# Patient Record
Sex: Female | Born: 1955 | Race: White | Hispanic: No | Marital: Married | Smoking: Former smoker
Health system: Southern US, Community
[De-identification: ages and names within clinical notes are randomized; demographics above are authoritative.]

## PROBLEM LIST (undated history)

## (undated) DIAGNOSIS — M1990 Unspecified osteoarthritis, unspecified site: Secondary | ICD-10-CM

## (undated) DIAGNOSIS — E213 Hyperparathyroidism, unspecified: Secondary | ICD-10-CM

## (undated) DIAGNOSIS — F419 Anxiety disorder, unspecified: Secondary | ICD-10-CM

## (undated) DIAGNOSIS — E7800 Pure hypercholesterolemia, unspecified: Secondary | ICD-10-CM

## (undated) DIAGNOSIS — T4145XA Adverse effect of unspecified anesthetic, initial encounter: Secondary | ICD-10-CM

## (undated) DIAGNOSIS — R0602 Shortness of breath: Secondary | ICD-10-CM

## (undated) DIAGNOSIS — T8859XA Other complications of anesthesia, initial encounter: Secondary | ICD-10-CM

## (undated) DIAGNOSIS — Z87442 Personal history of urinary calculi: Secondary | ICD-10-CM

## (undated) DIAGNOSIS — K8020 Calculus of gallbladder without cholecystitis without obstruction: Secondary | ICD-10-CM

## (undated) DIAGNOSIS — E0590 Thyrotoxicosis, unspecified without thyrotoxic crisis or storm: Secondary | ICD-10-CM

## (undated) DIAGNOSIS — D351 Benign neoplasm of parathyroid gland: Secondary | ICD-10-CM

## (undated) DIAGNOSIS — I2699 Other pulmonary embolism without acute cor pulmonale: Secondary | ICD-10-CM

## (undated) DIAGNOSIS — Z9889 Other specified postprocedural states: Secondary | ICD-10-CM

## (undated) DIAGNOSIS — K219 Gastro-esophageal reflux disease without esophagitis: Secondary | ICD-10-CM

## (undated) DIAGNOSIS — Z8719 Personal history of other diseases of the digestive system: Secondary | ICD-10-CM

## (undated) HISTORY — PX: ANKLE SURGERY: SHX546

## (undated) HISTORY — DX: Personal history of other diseases of the digestive system: Z87.19

## (undated) HISTORY — PX: TOTAL KNEE ARTHROPLASTY: SHX125

## (undated) HISTORY — PX: TONSILLECTOMY: SUR1361

## (undated) HISTORY — DX: Benign neoplasm of parathyroid gland: D35.1

## (undated) HISTORY — PX: FINGER SURGERY: SHX640

## (undated) HISTORY — PX: TIBIA FRACTURE SURGERY: SHX806

## (undated) HISTORY — DX: Other pulmonary embolism without acute cor pulmonale: I26.99

## (undated) HISTORY — PX: COLONOSCOPY: SHX174

## (undated) HISTORY — DX: Hyperparathyroidism, unspecified: E21.3

## (undated) HISTORY — DX: Other specified postprocedural states: Z98.890

## (undated) HISTORY — DX: Calculus of gallbladder without cholecystitis without obstruction: K80.20

## (undated) HISTORY — PX: CARDIAC CATHETERIZATION: SHX172

---

## 1993-09-29 HISTORY — PX: WRIST FRACTURE SURGERY: SHX121

## 2013-08-29 HISTORY — PX: KIDNEY STONE SURGERY: SHX686

## 2013-09-29 HISTORY — PX: CHOLECYSTECTOMY: SHX55

## 2014-04-04 ENCOUNTER — Ambulatory Visit (INDEPENDENT_AMBULATORY_CARE_PROVIDER_SITE_OTHER): Payer: BC Managed Care – PPO | Admitting: General Surgery

## 2014-04-04 ENCOUNTER — Other Ambulatory Visit (INDEPENDENT_AMBULATORY_CARE_PROVIDER_SITE_OTHER): Payer: Self-pay

## 2014-04-04 ENCOUNTER — Encounter (INDEPENDENT_AMBULATORY_CARE_PROVIDER_SITE_OTHER): Payer: Self-pay | Admitting: General Surgery

## 2014-04-04 VITALS — BP 142/82 | HR 68 | Temp 98.2°F | Resp 18 | Ht 68.0 in | Wt 263.0 lb

## 2014-04-04 DIAGNOSIS — C801 Malignant (primary) neoplasm, unspecified: Secondary | ICD-10-CM

## 2014-04-04 DIAGNOSIS — E210 Primary hyperparathyroidism: Secondary | ICD-10-CM | POA: Insufficient documentation

## 2014-04-04 NOTE — Progress Notes (Addendum)
Patient ID: Michele Nunez, female   DOB: Feb 13, 1956, 59 y.o.   MRN: [id number]  Chief Complaint  Patient presents with  . New Evaluation    parathy   Note: This dictation was prepared with Dragon/digital dictation along with [employer]. Any transcriptional errors that result from this process are unintentional.  HPI Michele Nunez is a 58 y.o. female.  She is referred by Dr. Gareth Eagle for evaluation and consideration of surgical management of primary hyperparathyroidism. Her primary care physician in [place] is Dr. Lauretta Chester.   The patient states she has been followed followed in [place] for hypercalcemia for a couple of years. She was recently referred to Dr. Wilson Singer because it persisted. She had a sestamibi scan in [place] which was inconclusive. The radiologist said there was minimal retention of activity along the inferior left lobe of the thyroid gland on delayed imaging. 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  Dr. Eugenio Hoes lab work shows a calcium level of 11.1 and an intact parathyroid hormone level of 101, normal being 15-65. TSH was 1.98.  Symptomatically the patient has a history of kidney stones, possibly 3 episodes, lithotripsy in [place] in November 2014. She has no GI symptoms although she has had GERD which is now well controlled with omeprazole. No psychiatric or mental problems. Mild fatigue. She has had 2 bone density tests, one in [place] and one here and she states that she was told they were normal. Her weight has been stable.  Comorbidities include active tobacco use, obesity with a BMI of 40, GERD, hyperlipidemia.  No family history of multiple endocrine neoplasia syndrome. She works in the [employer], a very hot job. HPI  History reviewed. No pertinent past medical history.  Past Surgical History  Procedure Laterality Date  . Cholecystectomy  2014   . Kidney stone surgery  07/2013  . Wrist fracture surgery  1995    No family history on file.  Social History History  Substance Use Topics  . Smoking status: Current Every Day Smoker -- 1.00 packs/day for 20 years    Types: Cigarettes  . Smokeless tobacco: Not on file  . Alcohol Use: Not on file    No Known Allergies  Current Outpatient Prescriptions  Medication Sig Dispense Refill  . aspirin 81 MG tablet Take 81 mg by mouth daily.      . Coenzyme Q10 (CO Q 10) 10 MG CAPS Take by mouth.      . rosuvastatin (CRESTOR) 10 MG tablet Take 10 mg by mouth daily.      . sertraline (ZOLOFT) 100 MG tablet Take 100 mg by mouth daily.      Michele Nunez zinc gluconate 50 MG tablet Take 50 mg by mouth daily.       No current facility-administered medications for this visit.    Review of Systems Review of Systems  Constitutional: Negative for fever, chills and unexpected weight change.  HENT: Negative for congestion, hearing loss, sore throat, trouble swallowing and voice change.   Eyes: Negative for visual disturbance.  Respiratory: Negative for cough and wheezing.   Cardiovascular: Negative for chest pain, palpitations and leg swelling.  Gastrointestinal: Negative for nausea, vomiting, abdominal pain, diarrhea, constipation, blood in stool, abdominal distention and anal bleeding.  Endocrine: Positive for polyuria.  Genitourinary: Negative for hematuria, vaginal bleeding and difficulty urinating.  Musculoskeletal: Negative for arthralgias.  Skin: Negative for rash and wound.  Neurological:  Negative for seizures, syncope and headaches.  Hematological: Negative for adenopathy. Does not bruise/bleed easily.  Psychiatric/Behavioral: Negative for confusion.    Blood pressure 142/82, pulse 68, temperature 98.2 F (36.8 C), resp. rate 18, height 5\' 8"  (1.727 m), weight 263 lb (119.296 kg).  Physical Exam Physical Exam  Constitutional: She is oriented to person, place, and time. She appears  well-developed and well-nourished. No distress.  HENT:  Head: Normocephalic and atraumatic.  Nose: Nose normal.  Mouth/Throat: No oropharyngeal exudate.  Eyes: Conjunctivae and EOM are normal. Pupils are equal, round, and reactive to light. Left eye exhibits no discharge. No scleral icterus.  Neck: Neck supple. No JVD present. No tracheal deviation present. No thyromegaly present.  No palpable mass in the neck.  Cardiovascular: Normal rate, regular rhythm, normal heart sounds and intact distal pulses.   No murmur heard. Pulmonary/Chest: Effort normal and breath sounds normal. No respiratory distress. She has no wheezes. She has no rales. She exhibits no tenderness.  Abdominal: Soft. Bowel sounds are normal. She exhibits no distension and no mass. There is no tenderness. There is no rebound and no guarding.  Musculoskeletal: She exhibits no edema and no tenderness.  Lymphadenopathy:    She has no cervical adenopathy.  Neurological: She is alert and oriented to person, place, and time. She exhibits normal muscle tone. Coordination normal.  Skin: Skin is warm. No rash noted. She is not diaphoretic. No erythema. No pallor.  Psychiatric: She has a normal mood and affect. Her behavior is normal. Judgment and thought content normal.    Data Reviewed Office notes and lab work from Dr. Eugenio Hoes office. Imaging report from [place].  Assessment    Primary hyperparathyroidism. Based on the lab test done in Dr. Eugenio Hoes office the diagnosis seems fairly secure. Localization is not secure, however, based on indeterminate sestamibi scan. It would be difficult to plan minimally invasive surgery based on this finding  Tobacco abuse  GERD, well controlled  Obesity  Hyperlipidemia  History kidney stones     Plan    I told her that I agreed with Dr. Eugenio Hoes diagnosis. I told her that ultimately I would probably advise elective parathyroidectomy  I told her I wanted more precise localization to  plan surgery, and she understands this. We will schedule her for MRI of neck Addendum: (04/18/2014)     MRI shows an 11 mm nodule inferior to the left thyroid lobe, suspicious for parathyroid adenoma.  I will repeat her labs one more time just to be sure.  Suspect these will confirm primary hyperparathyroidism. Addendum: PTH level 194, significantly elevated. Calcium 10.6.  She'll return to see me in 3 weeks for further discussion and possible surgical planning. Addendum: (04/21/2014)       I discussed the MRI and laboratory findings with Ms. Nunez. She wanted to go ahead and schedule the surgery and did not feel the need to return to the office for further discussion. We had a long talk about the indications and risks of surgery she seems to understand all these issues well. We will schedule her for minimally invasive parathyroidectomy in the near future.       Edsel Petrin. Dalbert Batman, M.D., [facility] General and Minimally invasive Surgery Breast and Colorectal Surgery Office:   [phone] Pager:   [phone]  04/04/2014, 5:35 PM

## 2014-04-04 NOTE — Patient Instructions (Signed)
I agree with Dr. Wilson Singer .   You almost certainly have a condition called primary hyperparathyroidism. This has developed slowly but will not go away until something is done.  This puts you  at risk for more kidney stones, muscle weakness, thinning of the bones, and  thinking problems.  You probably need an elective operation.  Before we schedule this surgery, I'm going to do another x-ray: MRI to see if we can localize which of the glands as enlarged. The scan that was done in [place]  is not diagnostic.     Parathyroidectomy A parathyroidectomy is surgery to remove one or more parathyroid glands. These glands produce a hormone (parathyroid hormone) that helps control the level of calcium in your body. The glands are very small, about the size of a pea. They are located in your neck, close to your thyroid gland and your Adam's apple. Most people (85%) have four parathyroid glands,some people may have one or two more than that. Hyperparathyroidism is when too much parathyroid hormone is being produced. Usually this is caused by one of the parathyroid glands becoming enlarged, but it can also be caused by more than one of the glands. Hyperparathyroidism is found during blood tests that show high calcium in the blood. Parathyroid hormone levels will also be elevated. Cancer also can cause hyperparathyroidism, but this is rare. For the most common type of hyperparathyroidism, the treatment is surgical removal of the parathyroid gland that is enlarged. For patients with kidney failure and hyperparathyroidism, other treatment will be tried before surgery is done on the parathyroid.  Many times x-ray studies are done to find out which parathyroid gland or glands is malfunctioning. The decision about the best treatment for hyperparathyroidism is between the patient, their primary doctor, an endocrinologist, and a surgeon experienced in parathyroid surgery. LET YOUR CAREGIVER KNOW ABOUT:  Any  allergies.  All medications you are taking, including:  Herbs, eyedrops, over-the-counter medications and creams.  Blood thinners (anticoagulants), aspirin or other drugs that could affect blood clotting.  Use of steroids (by mouth or as creams).  Previous problems with anesthetics, including local anesthetics.  Possibility of pregnancy, if this applies.  Any history of blood clots.  Any history of bleeding or other blood problems.  Previous surgery.  Smoking history.  Other health problems. RISKS AND COMPLICATIONS   Short-term possibilities include:  Excessive bleeding.  Pain.  Infection near the incision.  Slow healing.  Pooling of blood under the wound (hematoma).  Damage to nerves in your neck.  Blood clots.  Difficulty breathing. This is very rare. It also is almost always temporary.  Longer-term possibilities include:  Scarring.  Skin damage.  Damage to blood vessels in the area.  Need for additional surgery.  A hoarse or weak voice. This is usually temporary. It can be the result of nerve damage.  Development of hypoparathyroidism. This means you are not making enough parathyroid hormone. It is rare. If it occurs, you will need to take calcium supplements daily. BEFORE THE PROCEDURE  Sometimes the surgery is done on an outpatient basis. This means you could go home the same day as your surgery. Other times, people need to stay in the hospital overnight. Ask your surgeon what you should expect.  If your surgery will be an outpatient procedure, arrange for someone to drive you home after the surgery.  Two weeks before your surgery, stop using aspirin and non-steroidal anti-inflammatory drugs (NSAID's) for pain relief. This includes prescription drugs and over-the-counter  drugs such as ibuprofen and naproxen. Also stop taking vitamin E.  If you take blood-thinners, ask your healthcare provider when you should stop taking them.  Do not eat or drink  for about 8 hours before your surgery.  You might be asked to shower or wash with a special antibacterial soap before the procedure.  Arrive at least an hour before the surgery, or whenever your surgeon recommends. This will give you time to check in and fill out any needed paperwork. PROCEDURE  The preparation:  You will change into a hospital gown.  You will be given an IV. A needle will be inserted in your arm. Medication will be able to flow directly into your body through this needle.  You might be given a sedative to help you relax.  You will be given a drug that puts you to sleep during the surgery (general anesthetic).  The procedure:  Once you are asleep, the surgeon will make a small cut (incision) in your lower neck. Ask your surgeon where the incision will be.  The surgeon will look for the gland(s) that are not working well. Often a tissue sample from a gland is used to determine this.  Any glands that are not working well will be removed.  The surgeon will close the incision with stitches, often these are hidden under the skin. AFTER THE PROCEDURE  You will stay in a recovery area until the anesthesia has worn off. Your blood pressure and heart rate will be checked.  If your surgery was an outpatient procedure, you will go home the same day.  If you need to stay in the hospital, you will be moved to a hospital room. You will probably stay for two to three days. This will depend on how quickly you recover.  While you are in the hospital, your blood will be tested to check the calcium levels in your body. HOME CARE INSTRUCTIONS   Take any medication that your surgeon prescribes. Follow the directions carefully. Take all of the medication.  Ask your surgeon whether you can take over-the-counter medicines for pain, discomfort or fever. Do not take aspirin without permission from the surgeon. Aspirin increases the chances of bleeding.  Do not get the wound wet for  the first few days after surgery (or until the surgeon tells you it is OK).  After this procedure, many patients may develop low calcium levels in the blood. It is critical that you see your medical caregiver to have this monitored and managed.     SEEK MEDICAL CARE IF:   You notice blood or fluid leaking from the wound, or it becomes red or swollen.  You have trouble breathing.  You have trouble speaking.  You become nauseous or throw up for more than two days after the surgery.  You have a fever or persistent symptoms for more than 2-3 days. SEEK IMMEDIATE MEDICAL CARE IF:   Breathing becomes more difficult.  You have a fever and your symptoms suddenly get worse. Document Released: 12/12/2008 Document Revised: 09/01/2012 Document Reviewed: 12/12/2008 [facility] Patient Information 2015 [place], [place]. This information is not intended to replace advice given to you by your health care provider. Make sure you discuss any questions you have with your health care provider.

## 2014-04-13 ENCOUNTER — Telehealth (INDEPENDENT_AMBULATORY_CARE_PROVIDER_SITE_OTHER): Payer: Self-pay

## 2014-04-13 NOTE — Telephone Encounter (Signed)
Patient calling into office for detailed instructions on her scheduled MRI for 04/18/14 @ 10:00.  Patient confused about where she's scheduled to have her MRI, when she needs to have labs drawn and if she needs to drink the oral contrast given to her.  Please call patient to give more detailed instructions.  Patient advised Dr. Darrel Hoover nurse will call with further instructions.

## 2014-04-14 ENCOUNTER — Telehealth (INDEPENDENT_AMBULATORY_CARE_PROVIDER_SITE_OTHER): Payer: Self-pay

## 2014-04-14 NOTE — Telephone Encounter (Signed)
V/M X2 - MRI Neck @ [facility] at 10am arrive at 9am to have labs drawn (Bun/Creat) Patient is to Drink contrast starting at 8am then 2nd bottle @ 9 am SHE IS to be NPO 4 hrs prior to MRI

## 2014-04-17 ENCOUNTER — Ambulatory Visit (INDEPENDENT_AMBULATORY_CARE_PROVIDER_SITE_OTHER): Payer: BC Managed Care – PPO | Admitting: General Surgery

## 2014-04-18 ENCOUNTER — Other Ambulatory Visit (INDEPENDENT_AMBULATORY_CARE_PROVIDER_SITE_OTHER): Payer: Self-pay

## 2014-04-18 ENCOUNTER — Ambulatory Visit (HOSPITAL_COMMUNITY)
Admission: RE | Admit: 2014-04-18 | Discharge: 2014-04-18 | Disposition: A | Discharge status: Home / Self Care | Payer: BC Managed Care – PPO | Source: Ambulatory Visit | Attending: General Surgery | Admitting: General Surgery

## 2014-04-18 ENCOUNTER — Other Ambulatory Visit (INDEPENDENT_AMBULATORY_CARE_PROVIDER_SITE_OTHER): Payer: Self-pay | Admitting: General Surgery

## 2014-04-18 DIAGNOSIS — E213 Hyperparathyroidism, unspecified: Secondary | ICD-10-CM

## 2014-04-18 DIAGNOSIS — E041 Nontoxic single thyroid nodule: Secondary | ICD-10-CM | POA: Insufficient documentation

## 2014-04-18 LAB — BASIC METABOLIC PANEL
BUN: 17 mg/dL (ref 6–23)
CO2: 26 mEq/L (ref 19–32)
Calcium: 10.4 mg/dL (ref 8.4–10.5)
Chloride: 108 mEq/L (ref 96–112)
Creat: 0.91 mg/dL (ref 0.50–1.10)
Glucose, Bld: 100 mg/dL — ABNORMAL HIGH (ref 70–99)
Potassium: 4.2 mEq/L (ref 3.5–5.3)
Sodium: 142 mEq/L (ref 135–145)

## 2014-04-18 LAB — IRON AND TIBC
%SAT: 19 % — ABNORMAL LOW (ref 20–55)
Iron: 77 ug/dL (ref 42–145)
TIBC: 402 ug/dL (ref 250–470)
UIBC: 325 ug/dL (ref 125–400)

## 2014-04-18 LAB — BUN: BUN: 17 mg/dL (ref 6–23)

## 2014-04-18 LAB — CK: Total CK: 450 U/L — ABNORMAL HIGH (ref 7–177)

## 2014-04-18 MED ORDER — GADOBENATE DIMEGLUMINE 529 MG/ML IV SOLN
20.0000 mL | Freq: Once | INTRAVENOUS | Status: AC | PRN
Start: 2014-04-18 — End: 2014-04-18
  Administered 2014-04-18: 20 mL via INTRAVENOUS

## 2014-04-19 LAB — PTH, INTACT AND CALCIUM
Calcium: 10.6 mg/dL — ABNORMAL HIGH (ref 8.4–10.5)
PTH: 197.4 pg/mL — ABNORMAL HIGH (ref 14.0–72.0)

## 2014-04-21 ENCOUNTER — Telehealth (INDEPENDENT_AMBULATORY_CARE_PROVIDER_SITE_OTHER): Payer: Self-pay | Admitting: General Surgery

## 2014-04-21 ENCOUNTER — Other Ambulatory Visit (INDEPENDENT_AMBULATORY_CARE_PROVIDER_SITE_OTHER): Payer: Self-pay | Admitting: General Surgery

## 2014-04-21 ENCOUNTER — Telehealth (INDEPENDENT_AMBULATORY_CARE_PROVIDER_SITE_OTHER): Payer: Self-pay | Admitting: *Deleted

## 2014-04-21 NOTE — Telephone Encounter (Signed)
Lab work shows parathyroid hormone level 194., significantly elevated. This is in association with a calcium of 10.6. MRI shows an 11 x 6 mm nodule in inferior to the left thyroid lobe, suspicious for parathyroid adenoma given history. This correlates with her nuclear medicine scan.  I called Michele Nunez and discussed these results with her. I told her that these tests confirmed primary hyperparathyroidism and probably the adenoma was in the left inferior position. I proposed a minimally invasive parathyroidectomy. I told her that it was possible we would not find the gland and we might have to explore all 4 glands. We discussed all the indications, details, techniques, and numerous risks of the surgery once again. She understands all these issues. She has read the patient info.  Booklet.  She does not want to return to the office. She simply wants to go ahead and schedule the surgery this summer.  She will be scheduled for minimally invasive parathyroidectomy at her convenience in the near future.  Edsel Petrin. Dalbert Batman, M.D., [facility] General and Minimally invasive Surgery Breast and Colorectal Surgery Office:   [phone] Pager:   [phone]

## 2014-04-21 NOTE — Telephone Encounter (Signed)
Pt called wanting the results of her mri.  Please call her at [phone].  Thanks!  Anderson Malta

## 2014-04-21 NOTE — Telephone Encounter (Signed)
Dr. Dalbert Batman called the patient and gave results.

## 2014-04-24 ENCOUNTER — Encounter (INDEPENDENT_AMBULATORY_CARE_PROVIDER_SITE_OTHER): Payer: BC Managed Care – PPO | Admitting: General Surgery

## 2014-04-24 ENCOUNTER — Telehealth (INDEPENDENT_AMBULATORY_CARE_PROVIDER_SITE_OTHER): Payer: Self-pay

## 2014-04-24 NOTE — Telephone Encounter (Addendum)
Patient return call to office.  Patient advised that she does not need office visit today unless she would like to come in to speak with Dr. Dalbert Batman.  Patient declined appointment and will await our surgery schedulers to call and schedule surgery.  Patient states she was unaware of appointment for today.

## 2014-04-24 NOTE — Telephone Encounter (Signed)
Called and left message for patient to call our office regarding appointment for today.  Will await a return call from patient.

## 2014-05-25 ENCOUNTER — Encounter (HOSPITAL_COMMUNITY): Payer: Self-pay | Admitting: Pharmacy Technician

## 2014-05-26 NOTE — H&P (Signed)
Michele Nunez   MRN:  [id number]   Description: 58 year old female  Provider: Adin Hector, MD  Department: [facility]         Diagnoses      Hyperparathyroidism, primary    -  Primary      ICD-9-CM: 252.01 ICD-10-CM: E21.0            Current Vitals Most recent update: 04/04/2014  2:09 PM by Zenovia Jordan, LPN      BP Pulse Temp(Src) Resp Ht Wt      142/82 68 98.2 F (36.8 C) 18 5\' 8"  (1.727 m) 263 lb (119.296 kg)      BMI - 40.00 kg/m2                  History and Physical      Adin Hector, MD     Status: Addendum            Patient ID: Michele Nunez, female   DOB: [birth year], 58 y.o.   MRN: [id number]            Note:  This dictation was prepared with Dragon/digital dictation along with [facility] technology. Any transcriptional errors that result from this process are unintentional.   HPI Michele Nunez is a 58 y.o. female.  She is referred by Dr. Gareth Eagle for evaluation and consideration of surgical management of primary hyperparathyroidism. Her primary care physician in [place] is Dr. Lauretta Chester.    The patient states she has been followed followed in [place] for hypercalcemia for a couple of years. She was recently referred to Dr. Wilson Singer because it persisted. She had a sestamibi scan in [place] which was inconclusive. The radiologist said there was minimal retention of activity along the inferior left lobe of the thyroid gland on delayed imaging. 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   Dr. Eugenio Hoes lab work shows a calcium level of 11.1 and an intact parathyroid hormone level of 101, normal being 15-65. TSH was 1.98.   Symptomatically the patient has a history of kidney stones, possibly 3 episodes, lithotripsy in [place] in November 2014. She has no GI symptoms although she has had GERD which is now well controlled with omeprazole. No psychiatric or  mental problems. Mild fatigue. She has had 2 bone density tests, one in [place] and one here and she states that she was told they were normal. Her weight has been stable.   Comorbidities include active tobacco use, obesity with a BMI of 40, GERD, hyperlipidemia.   No family history of multiple endocrine neoplasia syndrome. She works in the [employer], a very hot job.        History reviewed. No pertinent past medical history.    Past Surgical History   Procedure  Laterality  Date   .  Cholecystectomy    2014   .  Kidney stone surgery    07/2013   .  Wrist fracture surgery    1995    No family history on file.   Social History History   Substance Use Topics   .  Smoking status:  Current Every Day Smoker -- 1.00 packs/day for 20 years       Types:  Cigarettes   .  Smokeless tobacco:  Not on file   .  Alcohol Use:  Not on file  No Known Allergies    Current Outpatient Prescriptions   Medication  Sig  Dispense  Refill   .  aspirin 81 MG tablet  Take 81 mg by mouth daily.         .  Coenzyme Q10 (CO Q 10) 10 MG CAPS  Take by mouth.         .  rosuvastatin (CRESTOR) 10 MG tablet  Take 10 mg by mouth daily.         .  sertraline (ZOLOFT) 100 MG tablet  Take 100 mg by mouth daily.         Marland Kitchen  zinc gluconate 50 MG tablet  Take 50 mg by mouth daily.       .        Review of Systems  Constitutional: Negative for fever, chills and unexpected weight change.  HENT: Negative for congestion, hearing loss, sore throat, trouble swallowing and voice change.   Eyes: Negative for visual disturbance.  Respiratory: Negative for cough and wheezing.   Cardiovascular: Negative for chest pain, palpitations and leg swelling.  Gastrointestinal: Negative for nausea, vomiting, abdominal pain, diarrhea, constipation, blood in stool, abdominal distention and anal bleeding.  Endocrine: Positive for polyuria.  Genitourinary: Negative for hematuria, vaginal bleeding  and difficulty urinating.  Musculoskeletal: Negative for arthralgias.  Skin: Negative for rash and wound.  Neurological: Negative for seizures, syncope and headaches.  Hematological: Negative for adenopathy. Does not bruise/bleed easily.  Psychiatric/Behavioral: Negative for confusion.      Blood pressure 142/82, pulse 68, temperature 98.2 F (36.8 C), resp. rate 18, height 5\' 8"  (1.727 m), weight 263 lb (119.296 kg).   Physical Exam  Constitutional: She is oriented to person, place, and time. She appears well-developed and well-nourished. No distress.  HENT:   Head: Normocephalic and atraumatic.   Nose: Nose normal.   Mouth/Throat: No oropharyngeal exudate.  Eyes: Conjunctivae and EOM are normal. Pupils are equal, round, and reactive to light. Left eye exhibits no discharge. No scleral icterus.  Neck: Neck supple. No JVD present. No tracheal deviation present. No thyromegaly present.  No palpable mass in the neck.  Cardiovascular: Normal rate, regular rhythm, normal heart sounds and intact distal pulses.    No murmur heard. Pulmonary/Chest: Effort normal and breath sounds normal. No respiratory distress. She has no wheezes. She has no rales. She exhibits no tenderness.  Abdominal: Soft. Bowel sounds are normal. She exhibits no distension and no mass. There is no tenderness. There is no rebound and no guarding.  Musculoskeletal: She exhibits no edema and no tenderness.  Lymphadenopathy:    She has no cervical adenopathy.  Neurological: She is alert and oriented to person, place, and time. She exhibits normal muscle tone. Coordination normal.  Skin: Skin is warm. No rash noted. She is not diaphoretic. No erythema. No pallor.  Psychiatric: She has a normal mood and affect. Her behavior is normal. Judgment and thought content normal.      Data Reviewed Office notes and lab work from Dr. Eugenio Hoes office. Imaging report from [place].   Assessment    Primary hyperparathyroidism.  Based on the lab test done in Dr. Eugenio Hoes office the diagnosis seems fairly secure. Localization is not secure, however, based on indeterminate sestamibi scan. It would be difficult to plan minimally invasive surgery based on this finding   Tobacco abuse   GERD, well controlled   Obesity   Hyperlipidemia   History kidney stones      Plan  I told her that I agreed with Dr. Eugenio Hoes diagnosis. I told her that ultimately I would probably advise elective parathyroidectomy   I told her I wanted more precise localization to plan surgery, and she understands this. We will schedule her for MRI of neck Addendum: (04/18/2014)     MRI shows an 11 mm nodule inferior to the left thyroid lobe, suspicious for parathyroid adenoma.   I will repeat her labs one more time just to be sure.  Suspect these will confirm primary hyperparathyroidism. Addendum: PTH level 194, significantly elevated. Calcium 10.6.   She'll return to see me in 3 weeks for further discussion and possible surgical planning. Addendum: (04/21/2014)       I discussed the MRI and laboratory findings with Ms. Serano. She wanted to go ahead and schedule the surgery and did not feel the need to return to the office for further discussion. We had a long talk about the indications and risks of surgery she seems to understand all these issues well. We will schedule her for minimally invasive parathyroidectomy in the near future.          Edsel Petrin. Dalbert Batman, M.D., [facility] General and Minimally invasive Surgery Breast and Colorectal Surgery Office:   [phone] Pager:   [phone]

## 2014-05-26 NOTE — Patient Instructions (Addendum)
[place]  05/29/2014   Your procedure is scheduled on: Wednesday 05/31/14  Report to [facility] at 06:30 AM.  Call this number if you have problems the morning of surgery [phone]: [phone]   Remember:   Do not eat food or drink liquids After Midnight.   Do not wear jewelry, make-up or nail polish.  Do not wear lotions, powders, or perfumes. You may wear deodorant.  Do not shave 48 hours prior to surgery. Men may shave face and neck.  Do not bring valuables to the hospital.  Contacts, dentures or bridgework may not be worn into surgery.  Leave suitcase in the car. After surgery it may be brought to your room.    Paulette Blanch, RN  pre op nurse call if needed [phone]     Before surgery, you can play an important role.  Because skin is not sterile, your skin needs to be as free of germs as possible.  You can reduce the number of germs on your skin by washing with CHG (chlorahexidine gluconate) soap before surgery.  CHG is an antiseptic cleaner which kills germs and bonds with the skin to continue killing germs even after washing. Please DO NOT use if you have an allergy to CHG or antibacterial soaps.  If your skin becomes reddened/irritated stop using the CHG and inform your nurse when you arrive at Short Stay. Do not shave (including legs and underarms) for at least 48 hours prior to the first CHG shower.  You may shave your face/neck. Please follow these instructions carefully:  1.  Shower with CHG Soap the night before surgery and the  morning of Surgery.  2.  If you choose to wash your hair, wash your hair first as usual with your  normal  shampoo.  3.  After you shampoo, rinse your hair and body thoroughly to remove the  shampoo.                            4.  Use CHG as you would any other liquid soap.  You can apply chg directly  to the skin and wash                       Gently with a scrungie or clean washcloth.  5.  Apply the CHG Soap to your body  ONLY FROM THE NECK DOWN.   Do not use on face/ open                           Wound or open sores. Avoid contact with eyes, ears mouth and genitals (private parts).                       Wash face,  Genitals (private parts) with your normal soap.             6.  Wash thoroughly, paying special attention to the area where your surgery  will be performed.  7.  Thoroughly rinse your body with warm water from the neck down.  8.  DO NOT shower/wash with your normal soap after using and rinsing off  the CHG Soap.                9.  Pat yourself dry with a clean towel.  10.  Wear clean pajamas.            11.  Place clean sheets on your bed the night of your first shower and do not  sleep with pets. Day of Surgery : Do not apply any lotions/deodorants the morning of surgery.  Please wear clean clothes to the hospital/surgery center.  FAILURE TO FOLLOW THESE INSTRUCTIONS MAY RESULT IN THE CANCELLATION OF YOUR SURGERY PATIENT SIGNATURE_________________________________  NURSE SIGNATURE__________________________________  ________________________________________________________________________

## 2014-05-29 ENCOUNTER — Encounter (HOSPITAL_COMMUNITY)
Admission: RE | Admit: 2014-05-29 | Discharge: 2014-05-29 | Disposition: A | Discharge status: Home / Self Care | Payer: BC Managed Care – PPO | Source: Ambulatory Visit | Attending: General Surgery | Admitting: General Surgery

## 2014-05-29 ENCOUNTER — Encounter (HOSPITAL_COMMUNITY): Payer: Self-pay

## 2014-05-29 DIAGNOSIS — Z87442 Personal history of urinary calculi: Secondary | ICD-10-CM | POA: Diagnosis not present

## 2014-05-29 DIAGNOSIS — D351 Benign neoplasm of parathyroid gland: Secondary | ICD-10-CM | POA: Diagnosis not present

## 2014-05-29 DIAGNOSIS — E210 Primary hyperparathyroidism: Secondary | ICD-10-CM | POA: Diagnosis present

## 2014-05-29 DIAGNOSIS — E669 Obesity, unspecified: Secondary | ICD-10-CM | POA: Diagnosis not present

## 2014-05-29 DIAGNOSIS — K219 Gastro-esophageal reflux disease without esophagitis: Secondary | ICD-10-CM | POA: Diagnosis not present

## 2014-05-29 DIAGNOSIS — E785 Hyperlipidemia, unspecified: Secondary | ICD-10-CM | POA: Diagnosis not present

## 2014-05-29 DIAGNOSIS — F17200 Nicotine dependence, unspecified, uncomplicated: Secondary | ICD-10-CM | POA: Diagnosis not present

## 2014-05-29 HISTORY — DX: Adverse effect of unspecified anesthetic, initial encounter: T41.45XA

## 2014-05-29 HISTORY — DX: Gastro-esophageal reflux disease without esophagitis: K21.9

## 2014-05-29 HISTORY — DX: Personal history of urinary calculi: Z87.442

## 2014-05-29 HISTORY — DX: Pure hypercholesterolemia, unspecified: E78.00

## 2014-05-29 HISTORY — DX: Unspecified osteoarthritis, unspecified site: M19.90

## 2014-05-29 HISTORY — DX: Shortness of breath: R06.02

## 2014-05-29 HISTORY — DX: Anxiety disorder, unspecified: F41.9

## 2014-05-29 HISTORY — DX: Thyrotoxicosis, unspecified without thyrotoxic crisis or storm: E05.90

## 2014-05-29 HISTORY — DX: Other complications of anesthesia, initial encounter: T88.59XA

## 2014-05-29 LAB — CBC WITH DIFFERENTIAL/PLATELET
Basophils Absolute: 0 10*3/uL (ref 0.0–0.1)
Basophils Relative: 0 % (ref 0–1)
Eosinophils Absolute: 0.2 10*3/uL (ref 0.0–0.7)
Eosinophils Relative: 2 % (ref 0–5)
HCT: 40.2 % (ref 36.0–46.0)
Hemoglobin: 12.7 g/dL (ref 12.0–15.0)
Lymphocytes Relative: 33 % (ref 12–46)
Lymphs Abs: 2.5 10*3/uL (ref 0.7–4.0)
MCH: 27.8 pg (ref 26.0–34.0)
MCHC: 31.6 g/dL (ref 30.0–36.0)
MCV: 88 fL (ref 78.0–100.0)
Monocytes Absolute: 0.7 10*3/uL (ref 0.1–1.0)
Monocytes Relative: 9 % (ref 3–12)
Neutro Abs: 4.2 10*3/uL (ref 1.7–7.7)
Neutrophils Relative %: 56 % (ref 43–77)
Platelets: 303 10*3/uL (ref 150–400)
RBC: 4.57 MIL/uL (ref 3.87–5.11)
RDW: 13 % (ref 11.5–15.5)
WBC: 7.6 10*3/uL (ref 4.0–10.5)

## 2014-05-29 LAB — COMPREHENSIVE METABOLIC PANEL
ALT: 23 U/L (ref 0–35)
AST: 21 U/L (ref 0–37)
Albumin: 4 g/dL (ref 3.5–5.2)
Alkaline Phosphatase: 93 U/L (ref 39–117)
Anion gap: 11 (ref 5–15)
BUN: 15 mg/dL (ref 6–23)
CO2: 27 mEq/L (ref 19–32)
Calcium: 10.7 mg/dL — ABNORMAL HIGH (ref 8.4–10.5)
Chloride: 104 mEq/L (ref 96–112)
Creatinine, Ser: 0.84 mg/dL (ref 0.50–1.10)
GFR calc Af Amer: 86 mL/min — ABNORMAL LOW (ref >90)
GFR calc non Af Amer: 75 mL/min — ABNORMAL LOW (ref >90)
Glucose, Bld: 88 mg/dL (ref 70–99)
Potassium: 4.2 mEq/L (ref 3.7–5.3)
Sodium: 142 mEq/L (ref 137–147)
Total Bilirubin: 0.4 mg/dL (ref 0.3–1.2)
Total Protein: 7.1 g/dL (ref 6.0–8.3)

## 2014-05-29 LAB — URINALYSIS, ROUTINE W REFLEX MICROSCOPIC
Bilirubin Urine: NEGATIVE
Glucose, UA: NEGATIVE mg/dL
Hgb urine dipstick: NEGATIVE
Ketones, ur: NEGATIVE mg/dL
Leukocytes, UA: NEGATIVE
Nitrite: NEGATIVE
Protein, ur: NEGATIVE mg/dL
Specific Gravity, Urine: 1.025 (ref 1.005–1.030)
Urobilinogen, UA: 0.2 mg/dL (ref 0.0–1.0)
pH: 5 (ref 5.0–8.0)

## 2014-05-29 NOTE — Progress Notes (Addendum)
Chest x-ray 02/14/14 on chart, lab results from 01/2014 on chart, anesthesia record 09/2012 on chart, LOV note Dr. Sherre Lain 09/20/12 on chart, operative note Dr. Sherre Lain 10/11/12 on chart, anesthesia record with operative note Dr. Sherre Lain 09/20/2012 on chart

## 2014-05-30 MED ORDER — DEXTROSE 5 % IV SOLN
3.0000 g | INTRAVENOUS | Status: AC
  Administered 2014-05-31: 3 g via INTRAVENOUS
  Filled 2014-05-30: qty 3000

## 2014-05-31 ENCOUNTER — Encounter (HOSPITAL_COMMUNITY): Payer: BC Managed Care – PPO | Admitting: Anesthesiology

## 2014-05-31 ENCOUNTER — Encounter (HOSPITAL_COMMUNITY): Payer: Self-pay | Admitting: Anesthesiology

## 2014-05-31 ENCOUNTER — Ambulatory Visit (HOSPITAL_COMMUNITY)
Admission: RE | Admit: 2014-05-31 | Discharge: 2014-06-01 | Disposition: A | Discharge status: Home / Self Care | Payer: BC Managed Care – PPO | Source: Ambulatory Visit | Attending: General Surgery | Admitting: General Surgery

## 2014-05-31 ENCOUNTER — Ambulatory Visit (HOSPITAL_COMMUNITY): Payer: BC Managed Care – PPO | Admitting: Anesthesiology

## 2014-05-31 ENCOUNTER — Encounter (HOSPITAL_COMMUNITY)
Admission: RE | Disposition: A | Discharge status: Home / Self Care | Payer: Self-pay | Source: Ambulatory Visit | Attending: General Surgery

## 2014-05-31 ENCOUNTER — Ambulatory Visit: Admit: 2014-05-31 | Payer: Self-pay | Admitting: General Surgery

## 2014-05-31 DIAGNOSIS — E785 Hyperlipidemia, unspecified: Secondary | ICD-10-CM | POA: Insufficient documentation

## 2014-05-31 DIAGNOSIS — D351 Benign neoplasm of parathyroid gland: Secondary | ICD-10-CM | POA: Insufficient documentation

## 2014-05-31 DIAGNOSIS — E210 Primary hyperparathyroidism: Secondary | ICD-10-CM | POA: Diagnosis not present

## 2014-05-31 DIAGNOSIS — F17200 Nicotine dependence, unspecified, uncomplicated: Secondary | ICD-10-CM | POA: Insufficient documentation

## 2014-05-31 DIAGNOSIS — E669 Obesity, unspecified: Secondary | ICD-10-CM | POA: Insufficient documentation

## 2014-05-31 DIAGNOSIS — K219 Gastro-esophageal reflux disease without esophagitis: Secondary | ICD-10-CM | POA: Insufficient documentation

## 2014-05-31 DIAGNOSIS — Z87442 Personal history of urinary calculi: Secondary | ICD-10-CM | POA: Insufficient documentation

## 2014-05-31 HISTORY — PX: MINIMALLY INVASIVE RADIOACTIVE PARATHYROIDECTOMY: SHX5272

## 2014-05-31 LAB — CALCIUM: Calcium: 10.1 mg/dL (ref 8.4–10.5)

## 2014-05-31 SURGERY — PARATHYROIDECTOMY, MINIMALLY INVASIVE, WITH INTRAOPERATIVE RADIONUCLIDE GUIDANCE
Anesthesia: General | Site: Neck

## 2014-05-31 SURGERY — PARATHYROIDECTOMY
Anesthesia: General

## 2014-05-31 MED ORDER — GLYCOPYRROLATE 0.2 MG/ML IJ SOLN
INTRAMUSCULAR | Status: DC | PRN
  Administered 2014-05-31: .8 mg via INTRAVENOUS

## 2014-05-31 MED ORDER — ZINC GLUCONATE 50 MG PO TABS: 50.0000 mg | ORAL_TABLET | Freq: Every day | ORAL | Status: DC

## 2014-05-31 MED ORDER — BUPIVACAINE-EPINEPHRINE 0.5% -1:200000 IJ SOLN
INTRAMUSCULAR | Status: DC | PRN
  Administered 2014-05-31: 6 mL

## 2014-05-31 MED ORDER — PHENYLEPHRINE HCL 10 MG/ML IJ SOLN
INTRAMUSCULAR | Status: DC | PRN
  Administered 2014-05-31: 80 ug via INTRAVENOUS

## 2014-05-31 MED ORDER — ONDANSETRON HCL 4 MG/2ML IJ SOLN
4.0000 mg | Freq: Four times a day (QID) | INTRAMUSCULAR | Status: DC | PRN
  Administered 2014-05-31: 4 mg via INTRAVENOUS
  Filled 2014-05-31: qty 2

## 2014-05-31 MED ORDER — NEOSTIGMINE METHYLSULFATE 10 MG/10ML IV SOLN
INTRAVENOUS | Status: DC | PRN
  Administered 2014-05-31: 5 mg via INTRAVENOUS

## 2014-05-31 MED ORDER — ENOXAPARIN SODIUM 40 MG/0.4ML ~~LOC~~ SOLN
40.0000 mg | SUBCUTANEOUS | Status: DC
  Administered 2014-06-01: 40 mg via SUBCUTANEOUS
  Filled 2014-05-31 (×2): qty 0.4

## 2014-05-31 MED ORDER — PROPOFOL 10 MG/ML IV BOLUS
INTRAVENOUS | Status: DC | PRN
  Administered 2014-05-31: 150 mg via INTRAVENOUS

## 2014-05-31 MED ORDER — SERTRALINE HCL 50 MG PO TABS
50.0000 mg | ORAL_TABLET | Freq: Every day | ORAL | Status: DC
  Administered 2014-05-31: 50 mg via ORAL
  Filled 2014-05-31 (×2): qty 1

## 2014-05-31 MED ORDER — METOCLOPRAMIDE HCL 5 MG/ML IJ SOLN
INTRAMUSCULAR | Status: DC | PRN
  Administered 2014-05-31: 10 mg via INTRAVENOUS

## 2014-05-31 MED ORDER — FENTANYL CITRATE 0.05 MG/ML IJ SOLN
INTRAMUSCULAR | Status: DC | PRN
  Administered 2014-05-31: 100 ug via INTRAVENOUS

## 2014-05-31 MED ORDER — ONDANSETRON HCL 4 MG/2ML IJ SOLN
INTRAMUSCULAR | Status: DC | PRN
  Administered 2014-05-31: 4 mg via INTRAVENOUS

## 2014-05-31 MED ORDER — CO Q 10 10 MG PO CAPS: 10.0000 mg | ORAL_CAPSULE | Freq: Every day | ORAL | Status: DC

## 2014-05-31 MED ORDER — MIDAZOLAM HCL 2 MG/2ML IJ SOLN
INTRAMUSCULAR | Status: AC
  Filled 2014-05-31: qty 2

## 2014-05-31 MED ORDER — DEXAMETHASONE SODIUM PHOSPHATE 10 MG/ML IJ SOLN
INTRAMUSCULAR | Status: DC | PRN
  Administered 2014-05-31: 10 mg via INTRAVENOUS

## 2014-05-31 MED ORDER — NEOSTIGMINE METHYLSULFATE 10 MG/10ML IV SOLN
INTRAVENOUS | Status: AC
  Filled 2014-05-31: qty 1

## 2014-05-31 MED ORDER — FENTANYL CITRATE 0.05 MG/ML IJ SOLN
INTRAMUSCULAR | Status: AC
  Filled 2014-05-31: qty 2

## 2014-05-31 MED ORDER — CALCIUM CARBONATE 1250 (500 CA) MG PO TABS
2.0000 | ORAL_TABLET | Freq: Three times a day (TID) | ORAL | Status: DC
  Administered 2014-05-31 (×2): 1000 mg via ORAL
  Filled 2014-05-31 (×6): qty 2

## 2014-05-31 MED ORDER — MIDAZOLAM HCL 5 MG/5ML IJ SOLN
INTRAMUSCULAR | Status: DC | PRN
  Administered 2014-05-31: 2 mg via INTRAVENOUS

## 2014-05-31 MED ORDER — MEPERIDINE HCL 50 MG/ML IJ SOLN: 6.2500 mg | INTRAMUSCULAR | Status: DC | PRN

## 2014-05-31 MED ORDER — FENTANYL CITRATE 0.05 MG/ML IJ SOLN
25.0000 ug | INTRAMUSCULAR | Status: DC | PRN
  Administered 2014-05-31 (×2): 50 ug via INTRAVENOUS

## 2014-05-31 MED ORDER — FENTANYL CITRATE 0.05 MG/ML IJ SOLN
INTRAMUSCULAR | Status: AC
  Filled 2014-05-31: qty 5

## 2014-05-31 MED ORDER — PANTOPRAZOLE SODIUM 40 MG PO TBEC
40.0000 mg | DELAYED_RELEASE_TABLET | Freq: Every day | ORAL | Status: DC
  Administered 2014-05-31: 40 mg via ORAL
  Filled 2014-05-31 (×2): qty 1

## 2014-05-31 MED ORDER — LACTATED RINGERS IV SOLN: INTRAVENOUS | Status: DC

## 2014-05-31 MED ORDER — CISATRACURIUM BESYLATE (PF) 10 MG/5ML IV SOLN
INTRAVENOUS | Status: DC | PRN
  Administered 2014-05-31: 6 mg via INTRAVENOUS

## 2014-05-31 MED ORDER — SUCCINYLCHOLINE CHLORIDE 20 MG/ML IJ SOLN
INTRAMUSCULAR | Status: DC | PRN
  Administered 2014-05-31: 100 mg via INTRAVENOUS

## 2014-05-31 MED ORDER — GLYCOPYRROLATE 0.2 MG/ML IJ SOLN
INTRAMUSCULAR | Status: AC
Start: 2014-05-31 — End: 2014-05-31
  Filled 2014-05-31: qty 4

## 2014-05-31 MED ORDER — ONDANSETRON HCL 4 MG/2ML IJ SOLN
INTRAMUSCULAR | Status: AC
  Filled 2014-05-31: qty 2

## 2014-05-31 MED ORDER — DEXAMETHASONE SODIUM PHOSPHATE 10 MG/ML IJ SOLN
INTRAMUSCULAR | Status: AC
  Filled 2014-05-31: qty 1

## 2014-05-31 MED ORDER — CISATRACURIUM BESYLATE 20 MG/10ML IV SOLN
INTRAVENOUS | Status: AC
  Filled 2014-05-31: qty 10

## 2014-05-31 MED ORDER — POTASSIUM CHLORIDE IN NACL 20-0.9 MEQ/L-% IV SOLN
INTRAVENOUS | Status: DC
  Administered 2014-05-31 – 2014-06-01 (×2): via INTRAVENOUS
  Filled 2014-05-31 (×3): qty 1000

## 2014-05-31 MED ORDER — PROPOFOL 10 MG/ML IV BOLUS
INTRAVENOUS | Status: AC
  Filled 2014-05-31: qty 20

## 2014-05-31 MED ORDER — OXYCODONE-ACETAMINOPHEN 5-325 MG PO TABS
1.0000 | ORAL_TABLET | ORAL | Status: DC | PRN
  Administered 2014-05-31: 2 via ORAL
  Filled 2014-05-31: qty 2

## 2014-05-31 MED ORDER — ATORVASTATIN CALCIUM 10 MG PO TABS
10.0000 mg | ORAL_TABLET | Freq: Every day | ORAL | Status: DC
Start: 2014-05-31 — End: 2014-06-01
  Administered 2014-05-31: 10 mg via ORAL
  Filled 2014-05-31 (×2): qty 1

## 2014-05-31 MED ORDER — PROMETHAZINE HCL 25 MG/ML IJ SOLN: 6.2500 mg | INTRAMUSCULAR | Status: DC | PRN

## 2014-05-31 MED ORDER — 0.9 % SODIUM CHLORIDE (POUR BTL) OPTIME
TOPICAL | Status: DC | PRN
  Administered 2014-05-31: 1000 mL

## 2014-05-31 MED ORDER — METOCLOPRAMIDE HCL 5 MG/ML IJ SOLN
INTRAMUSCULAR | Status: AC
  Filled 2014-05-31: qty 2

## 2014-05-31 MED ORDER — LACTATED RINGERS IV SOLN
INTRAVENOUS | Status: DC | PRN
  Administered 2014-05-31 (×2): via INTRAVENOUS

## 2014-05-31 MED ORDER — BUPIVACAINE-EPINEPHRINE (PF) 0.5% -1:200000 IJ SOLN
INTRAMUSCULAR | Status: AC
  Filled 2014-05-31: qty 30

## 2014-05-31 MED ORDER — ONDANSETRON HCL 4 MG PO TABS: 4.0000 mg | ORAL_TABLET | Freq: Four times a day (QID) | ORAL | Status: DC | PRN

## 2014-05-31 MED ORDER — HYDROMORPHONE HCL PF 1 MG/ML IJ SOLN
0.5000 mg | INTRAMUSCULAR | Status: DC | PRN
  Administered 2014-05-31: 0.5 mg via INTRAVENOUS
  Filled 2014-05-31: qty 1

## 2014-05-31 SURGICAL SUPPLY — 45 items
ATTRACTOMAT 16X20 MAGNETIC DRP (DRAPES) ×2 IMPLANT
BENZOIN TINCTURE PRP APPL 2/3 (GAUZE/BANDAGES/DRESSINGS) IMPLANT
BLADE HEX COATED 2.75 (ELECTRODE) ×2 IMPLANT
BLADE SURG 15 STRL LF DISP TIS (BLADE) ×1 IMPLANT
BLADE SURG 15 STRL SS (BLADE) ×1
CHLORAPREP W/TINT 10.5 ML (MISCELLANEOUS) ×2 IMPLANT
CHLORAPREP W/TINT 26ML (MISCELLANEOUS) IMPLANT
CLIP TI MEDIUM 6 (CLIP) ×4 IMPLANT
CLIP TI WIDE RED SMALL 6 (CLIP) ×4 IMPLANT
DERMABOND ADVANCED (GAUZE/BANDAGES/DRESSINGS) ×1
DERMABOND ADVANCED .7 DNX12 (GAUZE/BANDAGES/DRESSINGS) ×1 IMPLANT
DISSECTOR ROUND CHERRY 3/8 STR (MISCELLANEOUS) ×2 IMPLANT
DRAPE PED LAPAROTOMY (DRAPES) ×2 IMPLANT
DRAPE POUCH INSTRU U-SHP 10X18 (DRAPES) ×2 IMPLANT
DRESSING SURGICEL FIBRLLR 1X2 (HEMOSTASIS) ×1 IMPLANT
DRSG SURGICEL FIBRILLAR 1X2 (HEMOSTASIS) ×2
ELECT REM PT RETURN 9FT ADLT (ELECTROSURGICAL) ×2
ELECTRODE REM PT RTRN 9FT ADLT (ELECTROSURGICAL) ×1 IMPLANT
GAUZE SPONGE 4X4 12PLY STRL (GAUZE/BANDAGES/DRESSINGS) IMPLANT
GAUZE SPONGE 4X4 16PLY XRAY LF (GAUZE/BANDAGES/DRESSINGS) ×2 IMPLANT
GLOVE EUDERMIC 7 POWDERFREE (GLOVE) ×2 IMPLANT
GOWN STRL REUS W/TWL XL LVL3 (GOWN DISPOSABLE) ×6 IMPLANT
HEMOSTAT SURGICEL 2X14 (HEMOSTASIS) IMPLANT
HOVERMATT SINGLE USE (MISCELLANEOUS) ×2 IMPLANT
KIT BASIN OR (CUSTOM PROCEDURE TRAY) ×2 IMPLANT
MANIFOLD NEPTUNE II (INSTRUMENTS) ×2 IMPLANT
NEEDLE HYPO 25X1 1.5 SAFETY (NEEDLE) ×2 IMPLANT
PACK BASIC VI WITH GOWN DISP (CUSTOM PROCEDURE TRAY) ×2 IMPLANT
PENCIL BUTTON HOLSTER BLD 10FT (ELECTRODE) ×2 IMPLANT
SHEARS HARMONIC 9CM CVD (BLADE) ×2 IMPLANT
STAPLER VISISTAT 35W (STAPLE) ×2 IMPLANT
STRIP CLOSURE SKIN 1/2X4 (GAUZE/BANDAGES/DRESSINGS) IMPLANT
SUT MNCRL AB 4-0 PS2 18 (SUTURE) ×2 IMPLANT
SUT SILK 2 0 (SUTURE)
SUT SILK 2-0 18XBRD TIE 12 (SUTURE) IMPLANT
SUT SILK 3 0 (SUTURE)
SUT SILK 3-0 18XBRD TIE 12 (SUTURE) IMPLANT
SUT VIC AB 3-0 54XBRD REEL (SUTURE) IMPLANT
SUT VIC AB 3-0 BRD 54 (SUTURE)
SUT VIC AB 3-0 SH 18 (SUTURE) ×2 IMPLANT
SYR BULB IRRIGATION 50ML (SYRINGE) ×2 IMPLANT
SYR CONTROL 10ML LL (SYRINGE) ×2 IMPLANT
TOWEL OR 17X26 10 PK STRL BLUE (TOWEL DISPOSABLE) ×2 IMPLANT
TOWEL OR NON WOVEN STRL DISP B (DISPOSABLE) ×2 IMPLANT
YANKAUER SUCT BULB TIP 10FT TU (MISCELLANEOUS) ×2 IMPLANT

## 2014-05-31 NOTE — Op Note (Signed)
Patient Name:           Michele Nunez   Date of Surgery:        05/31/2014  Note: This dictation was prepared with Dragon/digital dictation along with [facility] technology. Any transcriptional errors that result from this process are unintentional.   Pre op Diagnosis:      Primary hyperparathyroidism  Post op Diagnosis:    Same  Procedure:                 Minimally invasive parathyroidectomy, removal of left inferior parathyroid gland  Surgeon:                     Edsel Petrin. Dalbert Batman, M.D., FACS  Assistant:                      Ralene Ok, M.D.  Operative Indications:   Michele Nunez is a 59 y.o. female. She is referred by Dr. Gareth Eagle for evaluation and consideration of surgical management of primary hyperparathyroidism. Her primary care physician in [place] is Dr. Lauretta Chester.  The patient states she has been followed followed in [place] for hypercalcemia for a couple of years. She was recently referred to Dr. Wilson Singer because it persisted. She had a sestamibi scan in [place] which was inconclusive. The radiologist said there was minimal retention of activity along the inferior left lobe of the thyroid gland on delayed imaging. 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  Dr. Eugenio Hoes lab work shows a calcium level of 11.1 and an intact parathyroid hormone level of 101, normal being 15-65. TSH was 1.98. MRI was performed and showed an 11 x 7 mm nodule just below the left lower thyroid lobe, anterior lateral to the trachea. Symptomatically the patient has a history of kidney stones, possibly 3 episodes, lithotripsy in [place] in November 2014. Comorbidities include active tobacco use, obesity with a BMI of 40, GERD, hyperlipidemia.  No family history of multiple endocrine neoplasia syndrome.  She is brought to the operating room electively   Operative Findings:       A hyperplastic parathyroid gland was  found in the left inferior position. This was about 1 cm x 6 mm in size.  This was below the lower pole of the left thyroid lobe lateral to the trachea.  Frozen section showed parathyroid hyperplasia consistent with an adenoma  Procedure in Detail:          Following the induction of general endotracheal anesthesia the patient was positioned with her arms at her sides and her neck extended. The neck was prepped and draped in a sterile fashion. Surgical time out was performed. Intravenous antibiotics were given. 0.5% Marcaine with epinephrine was used as local infiltration anesthetic.      A  Transverse incision was made in the left neck in a skin crease. This was about 2 cm above the clavicle. Dissection was carried down through the platysma muscle. Skin and platysma flaps were raised superiorly and inferiorly. Strap muscles were divided in the midline. Strap muscles were slowly dissected off of the left thyroid lobe. We did not immediately identify the adenoma. Mobilized the lower pole somewhat by dividing some of the lower pole veins. We took the dissection inferiorly and eventually identified a rust-colored nodule about 1 cm below the lower pole of the thyroid up against the trachea. This was slowly dissected out and it looked  like an adenoma. This was completely removed and sent to the lab. The pathologist performed frozen section and confirmed hyperplastic parathyroid tissue consistent with an adenoma. We felt that this was all that needed to be done. The wound was irrigated with saline. Hemostasis was excellent. I placed some fibrillar hemostatic sponge in the bed. Strap muscles were closed with interrupted 3-0 Vicryl. The platysma was closed with interrupted 3-0 Vicryl and the skin closed with a running 4-0 Monocryl subcuticular suture and Dermabond. The patient tolerated the procedure well was taken to PACU in stable condition. EBL 10 cc. Counts correct. Complications none.     Edsel Petrin. Dalbert Batman,  M.D., FACS General and Minimally Invasive Surgery Breast and Colorectal Surgery  05/31/2014 9:39 AM

## 2014-05-31 NOTE — Transfer of Care (Signed)
Immediate Anesthesia Transfer of Care Note  Patient: Michele Nunez  Procedure(s) Performed: Procedure(s): LEFT INFERIOR PARATHYROIDECTOMY  (N/A)  Patient Location: PACU  Anesthesia Type:General  Level of Consciousness: awake, sedated and patient cooperative  Airway & Oxygen Therapy: Patient Spontanous Breathing and Patient connected to face mask oxygen  Post-op Assessment: Report given to PACU RN and Post -op Vital signs reviewed and stable  Post vital signs: Reviewed and stable  Complications: No apparent anesthesia complications

## 2014-05-31 NOTE — Anesthesia Postprocedure Evaluation (Signed)
  Anesthesia Post-op Note  Patient: Michele Nunez  Procedure(s) Performed: Procedure(s) (LRB): LEFT INFERIOR PARATHYROIDECTOMY  (N/A)  Patient Location: PACU  Anesthesia Type: General  Level of Consciousness: awake and alert   Airway and Oxygen Therapy: Patient Spontanous Breathing  Post-op Pain: mild  Post-op Assessment: Post-op Vital signs reviewed, Patient's Cardiovascular Status Stable, Respiratory Function Stable, Patent Airway and No signs of Nausea or vomiting  Last Vitals:  Filed Vitals:   05/31/14 1045  BP: 147/56  Pulse: 68  Temp:   Resp: 18    Post-op Vital Signs: stable   Complications: No apparent anesthesia complications

## 2014-05-31 NOTE — Anesthesia Preprocedure Evaluation (Addendum)
Anesthesia Evaluation  Patient identified by MRN, date of birth, ID band Patient awake    Reviewed: Allergy & Precautions, H&P , NPO status , Patient's Chart, lab work & pertinent test results  Airway Mallampati: II TM Distance: >3 FB Neck ROM: Full    Dental no notable dental hx.    Pulmonary neg pulmonary ROS, former smoker,  breath sounds clear to auscultation  Pulmonary exam normal       Cardiovascular negative cardio ROS  Rhythm:Regular Rate:Normal     Neuro/Psych negative neurological ROS  negative psych ROS   GI/Hepatic negative GI ROS, Neg liver ROS,   Endo/Other  negative endocrine ROSMorbid obesity  Renal/GU negative Renal ROS  negative genitourinary   Musculoskeletal negative musculoskeletal ROS (+)   Abdominal   Peds negative pediatric ROS (+)  Hematology negative hematology ROS (+)   Anesthesia Other Findings   Reproductive/Obstetrics negative OB ROS                          Anesthesia Physical Anesthesia Plan  ASA: III  Anesthesia Plan: General   Post-op Pain Management:    Induction: Intravenous  Airway Management Planned: Oral ETT  Additional Equipment:   Intra-op Plan:   Post-operative Plan: Extubation in OR  Informed Consent: I have reviewed the patients History and Physical, chart, labs and discussed the procedure including the risks, benefits and alternatives for the proposed anesthesia with the patient or authorized representative who has indicated his/her understanding and acceptance.   Dental advisory given  Plan Discussed with: CRNA  Anesthesia Plan Comments:         Anesthesia Quick Evaluation

## 2014-05-31 NOTE — Interval H&P Note (Signed)
History and Physical Interval Note:  05/31/2014 8:09 AM  Michele Nunez  has presented today for surgery, with the diagnosis of primary hyperparathyroidism  The goals and the various methods of treatment have been discussed with the patient and family. After consideration of risks, benefits and other options for treatment, the patient has consented to  Procedure(s): PARATHYROIDECTOMY MINIMALLY INVASIVE (N/A) as a surgical intervention .  The patient's history has been reviewed, patient examined, no change in status, stable for surgery.  I have reviewed the patient's chart and labs.  Questions were answered to the patient's satisfaction.     Adin Hector

## 2014-06-01 ENCOUNTER — Other Ambulatory Visit (INDEPENDENT_AMBULATORY_CARE_PROVIDER_SITE_OTHER): Payer: Self-pay

## 2014-06-01 ENCOUNTER — Encounter (HOSPITAL_COMMUNITY): Payer: Self-pay | Admitting: General Surgery

## 2014-06-01 DIAGNOSIS — E210 Primary hyperparathyroidism: Secondary | ICD-10-CM | POA: Diagnosis not present

## 2014-06-01 DIAGNOSIS — E215 Disorder of parathyroid gland, unspecified: Secondary | ICD-10-CM

## 2014-06-01 LAB — CALCIUM: Calcium: 9.8 mg/dL (ref 8.4–10.5)

## 2014-06-01 MED ORDER — HYDROCODONE-ACETAMINOPHEN 5-325 MG PO TABS: 1.0000 | ORAL_TABLET | Freq: Four times a day (QID) | ORAL | Status: DC | PRN

## 2014-06-01 NOTE — Discharge Summary (Signed)
Patient ID: Michele Nunez [id number] 58 y.o. 04-08-1956  Admit date: 05/31/2014  Discharge date and time: 06/01/2014  Admitting Physician: Adin Hector  Discharge Physician: Adin Hector  Admission Diagnoses: primary hyperparathyroidism  Discharge Diagnoses: Primary hyperparathyroidism Tobacco abuse  GERD, well controlled  Obesity  Hyperlipidemia  History kidney stones   Operations: Procedure(s): LEFT INFERIOR PARATHYROIDECTOMY   Admission Condition: good  Discharged Condition: good  Indication for Admission: Michele Nunez is a 58 y.o. female. She is referred by Dr. Gareth Eagle for evaluation and consideration of surgical management of primary hyperparathyroidism. Her primary care physician in [place] is Dr. Lauretta Chester.  The patient states she has been followed followed in [place] for hypercalcemia for a couple of years. She was recently referred to Dr. Wilson Singer because it persisted. She had a sestamibi scan in [place] which was inconclusive. The radiologist said there was minimal retention of activity along the inferior left lobe of the thyroid gland on delayed imaging. 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  Dr. Eugenio Hoes lab work shows a calcium level of 11.1 and an intact parathyroid hormone level of 101, normal being 15-65. TSH was 1.98. MRI was performed and showed an 11 x 7 mm nodule just below the left lower thyroid lobe, anterior lateral to the trachea.  Symptomatically the patient has a history of kidney stones, possibly 3 episodes, lithotripsy in [place] in November 2014.  She was admitted electively for parathyroidectomy   Hospital Course: On the day of admission the patient was taken to the operating room and underwent a minimally invasive parathyroidectomy. 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 This was confirmed by frozen section. Postoperatively the patient was observed overnight and did well. She had no swallowing problems and no voice change. No paresthesias. She was placed on calcium carbonate 3 times a day. Postoperative day 1 her potassium level was down to 9.8. Examination at the time of discharge revealed that she was alert and in no distress. Voice was strong. Left neck wound looked good. No hematoma. Chvostek's sign was negative bilaterally. Final pathology is pending at this time.     She was given a prescription for Norco for pain. She was told to take 2 large TUMS  tablets twice a day for calcium supplementation. She was told to have blood drawn for calcium level in one week. She was told to return to see me in the office in 3 weeks. She has a very strenuous job and I told her to stay out of work for 3 weeks. Diet activities were discussed.  Consults: None  Significant Diagnostic Studies: Calcium levels. Surgical pathology, pending  Treatments: surgery: Minimally invasive parathyroidectomy, left inferior.  Disposition: Home  Patient Instructions:    Medication List         aspirin 81 MG tablet  Take 81 mg by mouth daily.     Co Q 10 10 MG Caps  Take 10 mg by mouth daily.     HYDROcodone-acetaminophen 5-325 MG per tablet  Commonly known as:  NORCO  Take 1-2 tablets by mouth every 6 (six) hours as needed.     KRILL OIL PO  Take 1 capsule by mouth daily.     omeprazole 20 MG capsule  Commonly known as:  PRILOSEC  Take 20 mg by mouth as needed.  rosuvastatin 10 MG tablet  Commonly known as:  CRESTOR  Take 10 mg by mouth daily.     sertraline 100 MG tablet  Commonly known as:  ZOLOFT  Take 50 mg by mouth at bedtime.     zinc gluconate 50 MG tablet  Take 50 mg by mouth daily.        Activity: activity as tolerated Diet: low fat, low cholesterol diet Wound Care: none needed  Follow-up:  With Dr. Dalbert Batman in 3  weeks.  Signed: Edsel Petrin. Dalbert Batman, M.D., FACS General and minimally invasive surgery Breast and Colorectal Surgery  06/01/2014, 6:53 AM

## 2014-06-16 ENCOUNTER — Ambulatory Visit (HOSPITAL_COMMUNITY)
Admission: RE | Admit: 2014-06-16 | Discharge: 2014-06-16 | Disposition: A | Discharge status: Home / Self Care | Payer: BC Managed Care – PPO | Source: Ambulatory Visit | Attending: General Surgery | Admitting: General Surgery

## 2014-06-16 ENCOUNTER — Emergency Department (HOSPITAL_COMMUNITY): Payer: BC Managed Care – PPO

## 2014-06-16 ENCOUNTER — Inpatient Hospital Stay (HOSPITAL_COMMUNITY)
Admission: EM | Admit: 2014-06-16 | Discharge: 2014-06-20 | DRG: 176 | Disposition: A | Discharge status: Home / Self Care | Payer: BC Managed Care – PPO | Attending: Internal Medicine | Admitting: Internal Medicine

## 2014-06-16 ENCOUNTER — Encounter (HOSPITAL_COMMUNITY): Payer: Self-pay | Admitting: Emergency Medicine

## 2014-06-16 ENCOUNTER — Other Ambulatory Visit (HOSPITAL_COMMUNITY): Payer: Self-pay | Admitting: General Surgery

## 2014-06-16 ENCOUNTER — Other Ambulatory Visit (INDEPENDENT_AMBULATORY_CARE_PROVIDER_SITE_OTHER): Payer: Self-pay

## 2014-06-16 ENCOUNTER — Encounter (INDEPENDENT_AMBULATORY_CARE_PROVIDER_SITE_OTHER): Payer: BC Managed Care – PPO | Admitting: General Surgery

## 2014-06-16 DIAGNOSIS — F411 Generalized anxiety disorder: Secondary | ICD-10-CM | POA: Diagnosis present

## 2014-06-16 DIAGNOSIS — K219 Gastro-esophageal reflux disease without esophagitis: Secondary | ICD-10-CM | POA: Diagnosis present

## 2014-06-16 DIAGNOSIS — E669 Obesity, unspecified: Secondary | ICD-10-CM | POA: Diagnosis present

## 2014-06-16 DIAGNOSIS — R0602 Shortness of breath: Secondary | ICD-10-CM

## 2014-06-16 DIAGNOSIS — Z87891 Personal history of nicotine dependence: Secondary | ICD-10-CM | POA: Diagnosis not present

## 2014-06-16 DIAGNOSIS — I824Z9 Acute embolism and thrombosis of unspecified deep veins of unspecified distal lower extremity: Secondary | ICD-10-CM

## 2014-06-16 DIAGNOSIS — Z7982 Long term (current) use of aspirin: Secondary | ICD-10-CM | POA: Diagnosis not present

## 2014-06-16 DIAGNOSIS — M79606 Pain in leg, unspecified: Secondary | ICD-10-CM

## 2014-06-16 DIAGNOSIS — M79609 Pain in unspecified limb: Secondary | ICD-10-CM

## 2014-06-16 DIAGNOSIS — E210 Primary hyperparathyroidism: Secondary | ICD-10-CM | POA: Diagnosis present

## 2014-06-16 DIAGNOSIS — F329 Major depressive disorder, single episode, unspecified: Secondary | ICD-10-CM | POA: Diagnosis present

## 2014-06-16 DIAGNOSIS — E7800 Pure hypercholesterolemia, unspecified: Secondary | ICD-10-CM | POA: Diagnosis present

## 2014-06-16 DIAGNOSIS — F3289 Other specified depressive episodes: Secondary | ICD-10-CM | POA: Diagnosis present

## 2014-06-16 DIAGNOSIS — E0590 Thyrotoxicosis, unspecified without thyrotoxic crisis or storm: Secondary | ICD-10-CM | POA: Diagnosis present

## 2014-06-16 DIAGNOSIS — I2699 Other pulmonary embolism without acute cor pulmonale: Secondary | ICD-10-CM | POA: Diagnosis present

## 2014-06-16 DIAGNOSIS — R209 Unspecified disturbances of skin sensation: Secondary | ICD-10-CM | POA: Diagnosis present

## 2014-06-16 DIAGNOSIS — I82409 Acute embolism and thrombosis of unspecified deep veins of unspecified lower extremity: Secondary | ICD-10-CM | POA: Diagnosis present

## 2014-06-16 DIAGNOSIS — M79662 Pain in left lower leg: Secondary | ICD-10-CM

## 2014-06-16 DIAGNOSIS — Z79899 Other long term (current) drug therapy: Secondary | ICD-10-CM | POA: Diagnosis not present

## 2014-06-16 DIAGNOSIS — M79661 Pain in right lower leg: Secondary | ICD-10-CM

## 2014-06-16 DIAGNOSIS — I82403 Acute embolism and thrombosis of unspecified deep veins of lower extremity, bilateral: Secondary | ICD-10-CM | POA: Diagnosis present

## 2014-06-16 LAB — CBC
HCT: 38.9 % (ref 36.0–46.0)
Hemoglobin: 12.4 g/dL (ref 12.0–15.0)
MCH: 28.1 pg (ref 26.0–34.0)
MCHC: 31.9 g/dL (ref 30.0–36.0)
MCV: 88.2 fL (ref 78.0–100.0)
Platelets: 261 10*3/uL (ref 150–400)
RBC: 4.41 MIL/uL (ref 3.87–5.11)
RDW: 13.2 % (ref 11.5–15.5)
WBC: 11.2 10*3/uL — ABNORMAL HIGH (ref 4.0–10.5)

## 2014-06-16 LAB — PRO B NATRIURETIC PEPTIDE: Pro B Natriuretic peptide (BNP): 114.7 pg/mL (ref 0–125)

## 2014-06-16 LAB — I-STAT TROPONIN, ED: Troponin i, poc: 0 ng/mL (ref 0.00–0.08)

## 2014-06-16 LAB — BASIC METABOLIC PANEL
Anion gap: 14 (ref 5–15)
BUN: 13 mg/dL (ref 6–23)
CO2: 25 mEq/L (ref 19–32)
Calcium: 9.4 mg/dL (ref 8.4–10.5)
Chloride: 101 mEq/L (ref 96–112)
Creatinine, Ser: 0.9 mg/dL (ref 0.50–1.10)
GFR calc Af Amer: 80 mL/min — ABNORMAL LOW (ref >90)
GFR calc non Af Amer: 69 mL/min — ABNORMAL LOW (ref >90)
Glucose, Bld: 105 mg/dL — ABNORMAL HIGH (ref 70–99)
Potassium: 4.1 mEq/L (ref 3.7–5.3)
Sodium: 140 mEq/L (ref 137–147)

## 2014-06-16 MED ORDER — ENOXAPARIN SODIUM 120 MG/0.8ML ~~LOC~~ SOLN
120.0000 mg | Freq: Once | SUBCUTANEOUS | Status: AC
  Administered 2014-06-16: 120 mg via SUBCUTANEOUS
  Filled 2014-06-16: qty 0.8

## 2014-06-16 MED ORDER — WARFARIN VIDEO: Freq: Once | Status: DC

## 2014-06-16 MED ORDER — SERTRALINE HCL 50 MG PO TABS
50.0000 mg | ORAL_TABLET | Freq: Every day | ORAL | Status: DC
  Administered 2014-06-17 – 2014-06-19 (×4): 50 mg via ORAL
  Filled 2014-06-16 (×5): qty 1

## 2014-06-16 MED ORDER — SODIUM CHLORIDE 0.9 % IJ SOLN: 3.0000 mL | INTRAMUSCULAR | Status: DC | PRN

## 2014-06-16 MED ORDER — ZOLPIDEM TARTRATE 5 MG PO TABS
5.0000 mg | ORAL_TABLET | Freq: Every evening | ORAL | Status: DC | PRN
  Administered 2014-06-17 – 2014-06-19 (×2): 5 mg via ORAL
  Filled 2014-06-16 (×2): qty 1

## 2014-06-16 MED ORDER — SODIUM CHLORIDE 0.9 % IV SOLN: 250.0000 mL | INTRAVENOUS | Status: DC | PRN

## 2014-06-16 MED ORDER — WARFARIN - PHARMACIST DOSING INPATIENT: Freq: Every day | Status: DC

## 2014-06-16 MED ORDER — PANTOPRAZOLE SODIUM 40 MG PO TBEC
40.0000 mg | DELAYED_RELEASE_TABLET | Freq: Every day | ORAL | Status: DC
  Administered 2014-06-17 – 2014-06-20 (×4): 40 mg via ORAL
  Filled 2014-06-16 (×4): qty 1

## 2014-06-16 MED ORDER — ATORVASTATIN CALCIUM 10 MG PO TABS
10.0000 mg | ORAL_TABLET | Freq: Every day | ORAL | Status: DC
  Administered 2014-06-17 – 2014-06-19 (×3): 10 mg via ORAL
  Filled 2014-06-16 (×4): qty 1

## 2014-06-16 MED ORDER — WARFARIN SODIUM 7.5 MG PO TABS
7.5000 mg | ORAL_TABLET | Freq: Every day | ORAL | Status: DC
  Administered 2014-06-16 – 2014-06-17 (×2): 7.5 mg via ORAL
  Filled 2014-06-16 (×4): qty 1

## 2014-06-16 MED ORDER — ENOXAPARIN SODIUM 150 MG/ML ~~LOC~~ SOLN: 1.0000 mg/kg | Freq: Once | SUBCUTANEOUS | Status: DC

## 2014-06-16 MED ORDER — OXYCODONE HCL 5 MG PO TABS
5.0000 mg | ORAL_TABLET | ORAL | Status: DC | PRN
  Administered 2014-06-17 – 2014-06-19 (×5): 5 mg via ORAL
  Filled 2014-06-16 (×5): qty 1

## 2014-06-16 MED ORDER — PATIENT'S GUIDE TO USING COUMADIN BOOK
Freq: Once | Status: AC
  Administered 2014-06-16: 1
  Filled 2014-06-16: qty 1

## 2014-06-16 MED ORDER — ENOXAPARIN SODIUM 150 MG/ML ~~LOC~~ SOLN: 1.0000 mg/kg | Freq: Two times a day (BID) | SUBCUTANEOUS | Status: DC

## 2014-06-16 MED ORDER — ENOXAPARIN SODIUM 120 MG/0.8ML ~~LOC~~ SOLN
120.0000 mg | Freq: Two times a day (BID) | SUBCUTANEOUS | Status: DC
  Filled 2014-06-16 (×2): qty 0.8

## 2014-06-16 MED ORDER — ONDANSETRON HCL 4 MG/2ML IJ SOLN: 4.0000 mg | Freq: Four times a day (QID) | INTRAMUSCULAR | Status: DC | PRN

## 2014-06-16 MED ORDER — ONDANSETRON HCL 4 MG PO TABS: 4.0000 mg | ORAL_TABLET | Freq: Four times a day (QID) | ORAL | Status: DC | PRN

## 2014-06-16 MED ORDER — SODIUM CHLORIDE 0.9 % IJ SOLN
3.0000 mL | Freq: Two times a day (BID) | INTRAMUSCULAR | Status: DC
  Administered 2014-06-17 – 2014-06-19 (×6): 3 mL via INTRAVENOUS
  Filled 2014-06-16: qty 3

## 2014-06-16 MED ORDER — ACETAMINOPHEN 650 MG RE SUPP: 650.0000 mg | Freq: Four times a day (QID) | RECTAL | Status: DC | PRN

## 2014-06-16 MED ORDER — IOHEXOL 350 MG/ML SOLN
100.0000 mL | Freq: Once | INTRAVENOUS | Status: AC | PRN
  Administered 2014-06-16: 100 mL via INTRAVENOUS

## 2014-06-16 MED ORDER — SODIUM CHLORIDE 0.9 % IJ SOLN
3.0000 mL | Freq: Two times a day (BID) | INTRAMUSCULAR | Status: DC
  Administered 2014-06-17 – 2014-06-19 (×5): 3 mL via INTRAVENOUS
  Filled 2014-06-16: qty 3

## 2014-06-16 MED ORDER — ACETAMINOPHEN 325 MG PO TABS: 650.0000 mg | ORAL_TABLET | Freq: Four times a day (QID) | ORAL | Status: DC | PRN

## 2014-06-16 NOTE — ED Notes (Signed)
Per pt sts she has been having bilateral leg pain and SOB since Monday. Pt recent surgery and positive for DVT in [place].

## 2014-06-16 NOTE — ED Notes (Signed)
Amitting MD at bedside.

## 2014-06-16 NOTE — Progress Notes (Signed)
ANTICOAGULATION CONSULT NOTE - Initial Consult  Pharmacy Consult for Lovenox / Coumadin Indication: pulmonary embolus  No Known Allergies  Patient Measurements: 120 kg  Vital Signs: Temp: 98.1 F (36.7 C) (09/18 1630) BP: 124/67 mmHg (09/18 2130) Pulse Rate: 83 (09/18 2130)  Labs:  Recent Labs  06/16/14 1639  HGB 12.4  HCT 38.9  PLT 261  CREATININE 0.90    The CrCl is unknown because both a height and weight (above a minimum accepted value) are required for this calculation.   Medical History: Past Medical History  Diagnosis Date  . GERD (gastroesophageal reflux disease)   . Shortness of breath     with activity  . Hypercholesteremia     "mild"  . History of kidney stones   . Anxiety     "panic attacks"  . Arthritis     "probably"  . Hyperthyroidism   . Complication of anesthesia     "could not breath and heard the doctor saying she was turning blue-woke up with bruises on chest and very sore for kidney stone surgery"-anesthesia record on chart    Assessment: 58 year old female to begin Lovenox and Coumadin for new PE and DVT  Goal of Therapy:  INR 2-3 Monitor platelets by anticoagulation protocol: Yes   Plan:  Lovenox 120 mg sq Q 12 hours Coumadin 7.5 mg po daily starting tonight Daily INR  Thank you. Anette Guarneri, PharmD [phone]  Tad Moore 06/16/2014,10:40 PM

## 2014-06-16 NOTE — Progress Notes (Signed)
VASCULAR LAB PRELIMINARY  PRELIMINARY  PRELIMINARY  PRELIMINARY  Bilateral lower extremity venous duplex  completed.    Preliminary report:  Bilateral:  DVT noted in the posterior tibial veins.  No evidence of superficial thrombosis.  No Baker's cyst.   Patient states she has had SOB since calves started hurting 5 days ago. Per Dr Fanny Skates, send to ED for CT chest and evaluate for possible admission with hospitalist.  Ralene Cork, RVT 06/16/2014, 3:23 PM

## 2014-06-16 NOTE — H&P (Addendum)
[facility]                                                                                    Patient Demographics  Michele Nunez, is a 58 y.o. female  CSN: [id number]  MRN: [id number]  DOB - [birth year]  Admit Date - 06/16/2014  Outpatient Primary MD for the patient is Quentin Cornwall, MD   With History of -  Past Medical History  Diagnosis Date  . GERD (gastroesophageal reflux disease)   . Shortness of breath     with activity  . Hypercholesteremia     "mild"  . History of kidney stones   . Anxiety     "panic attacks"  . Arthritis     "probably"  . Hyperthyroidism   . Complication of anesthesia     "could not breath and heard the doctor saying she was turning blue-woke up with bruises on chest and very sore for kidney stone surgery"-anesthesia record on chart      Past Surgical History  Procedure Laterality Date  . Kidney stone surgery  08/2013  . Wrist fracture surgery Right 1995  . Cardiac catheterization  20 years ago    clear  . Cholecystectomy  09/2013  . Colonoscopy    . Finger surgery Right     ring finger-pin  . Tonsillectomy  in 20's  . Minimally invasive radioactive parathyroidectomy N/A 05/31/2014    Procedure: LEFT INFERIOR PARATHYROIDECTOMY ;  Surgeon: Fanny Skates, MD;  Location: [facility];  Service: General;  Laterality: N/A;    in for   Chief Complaint  Patient presents with  . DVT  . Shortness of Breath     HPI  Michele Nunez  is a 58 y.o. female, with past medical history significant for left parathyroidectomy 2-1/2 weeks ago presenting with few days history of bilateral leg pains and shortness of breath. Lower extremity Dopplers were positive for DVTs and a CAT scan of the chest was positive for PE's . No history or family history of DVTs or hypercoagulable state.    Review of Systems    In addition to the HPI above,  No Fever-chills, No Headache, No changes with Vision or hearing, No problems swallowing food or  Liquids, No Chest pain,  No Abdominal pain, No Nausea or Vommitting, Bowel movements are regular, No Blood in stool or Urine, No dysuria, No new skin rashes or bruises, No new joints pains-aches,  No new weakness, tingling, numbness in any extremity, No recent weight gain or loss, No polyuria, polydypsia or polyphagia, No significant Mental Stressors.  A full 10 point Review of Systems was done, except as stated above, all other Review of Systems were negative.   Social History History  Substance Use Topics  . Smoking status: Former Smoker -- 1.00 packs/day for 20 years    Types: Cigarettes    Quit date: 09/29/1988  . Smokeless tobacco: Never Used  . Alcohol Use: No     Family History History of diabetes in mother but no history of DVT  Prior to Admission medications   Medication Sig Start Date End Date Taking? Authorizing Provider  aspirin 81 MG tablet  Take 81 mg by mouth daily.   Yes Historical Provider, MD  Coenzyme Q10 (CO Q 10) 10 MG CAPS Take 10 mg by mouth daily.    Yes Historical Provider, MD  KRILL OIL PO Take 1 capsule by mouth daily.   Yes Historical Provider, MD  omeprazole (PRILOSEC) 20 MG capsule Take 20 mg by mouth as needed.   Yes Historical Provider, MD  rosuvastatin (CRESTOR) 10 MG tablet Take 10 mg by mouth daily.   Yes Historical Provider, MD  sertraline (ZOLOFT) 100 MG tablet Take 50 mg by mouth at bedtime.    Yes Historical Provider, MD  zinc gluconate 50 MG tablet Take 50 mg by mouth daily.   Yes Historical Provider, MD    No Known Allergies  Physical Exam  Vitals  Blood pressure 124/67, pulse 83, temperature 98.1 F (36.7 C), resp. rate 21, SpO2 97.00%.   1. General white American female in no acute distress  2. Normal affect and insight, Not Suicidal or Homicidal, Awake Alert, Oriented X 3.  3. No F.N deficits, ALL C.Nerves Intact,  4. Ears and Eyes appear Normal, Conjunctivae clear, PERRLA. Moist Oral Mucosa.  5. Supple Neck, No JVD,  No cervical lymphadenopathy appriciated, No Carotid Bruits.  6. Symmetrical Chest wall movement, Good air movement bilaterally, CTAB.  7. RRR, No Gallops, Rubs or Murmurs, No Parasternal Heave.  8. Positive Bowel Sounds, Abdomen Soft, Non tender, No organomegaly appriciated,No rebound -guarding or rigidity.  9.  No Cyanosis, Normal Skin Turgor, No Skin Rash or Bruise.  10. Good muscle tone,  joints appear normal , no effusions, Normal ROM.  11. No Palpable Lymph Nodes in Neck or Axillae    Data Review  CBC  Recent Labs Lab 06/16/14 1639  WBC 11.2*  HGB 12.4  HCT 38.9  PLT 261  MCV 88.2  MCH 28.1  MCHC 31.9  RDW 13.2   ------------------------------------------------------------------------------------------------------------------  Chemistries   Recent Labs Lab 06/16/14 1639  NA 140  K 4.1  CL 101  CO2 25  GLUCOSE 105*  BUN 13  CREATININE 0.90  CALCIUM 9.4   ------------------------------------------------------------------------------------------------------------------ CrCl is unknown because both a height and weight (above a minimum accepted value) are required for this calculation. ------------------------------------------------------------------------------------------------------------------ No results found for this basename: TSH, T4TOTAL, FREET3, T3FREE, THYROIDAB,  in the last 72 hours   Coagulation profile No results found for this basename: INR, PROTIME,  in the last 168 hours ------------------------------------------------------------------------------------------------------------------- No results found for this basename: DDIMER,  in the last 72 hours -------------------------------------------------------------------------------------------------------------------  Cardiac Enzymes No results found for this basename: CK, CKMB, TROPONINI, MYOGLOBIN,  in the last 168  hours ------------------------------------------------------------------------------------------------------------------ No components found with this basename: POCBNP,    ---------------------------------------------------------------------------------------------------------------  Urinalysis    Component Value Date/Time   COLORURINE YELLOW 05/29/2014 1111   APPEARANCEUR CLOUDY* 05/29/2014 1111   LABSPEC 1.025 05/29/2014 1111   PHURINE 5.0 05/29/2014 1111   GLUCOSEU NEGATIVE 05/29/2014 1111   HGBUR NEGATIVE 05/29/2014 1111   BILIRUBINUR NEGATIVE 05/29/2014 1111   KETONESUR NEGATIVE 05/29/2014 1111   PROTEINUR NEGATIVE 05/29/2014 1111   UROBILINOGEN 0.2 05/29/2014 1111   NITRITE NEGATIVE 05/29/2014 [place] 05/29/2014 1111    ----------------------------------------------------------------------------------------------------------------   Imaging results:   Ct Angio Chest Pe W/cm &/or Wo Cm  06/16/2014   CLINICAL DATA:  Two and one half weeks postoperative from parathyroidectomynow with chest pain and shortness of breath and positive history of DVT  EXAM: CT ANGIOGRAPHY CHEST WITH CONTRAST  TECHNIQUE:  Multidetector CT imaging of the chest was performed using the standard protocol during bolus administration of intravenous contrast. Multiplanar CT image reconstructions and MIPs were obtained to evaluate the vascular anatomy.  CONTRAST:  126mL OMNIPAQUE IOHEXOL 350 MG/ML SOLN  COMPARISON:  PA and lateral chest x-ray of today's date.  FINDINGS: There are filling defects within the right middle and lower lobe pulmonary arteries. There also filling defects within branches of the left lower lobe pulmonary arteries. The RV -LV ratio is abnormal at 1.5. The overall cardiac size is not increased. There is no pericardial effusion. The thoracic aorta exhibits normal caliber and no evidence of a false lumen.  There is no pleural effusion. There no evidence of a pulmonary infarction or  other acute pulmonary parenchymal abnormality. There is no evidence of significant hematoma in the region of the thyroid bed.  The bony thorax is unremarkable. There is calcification of the anterior longitudinal ligament of the thoracic spine. Within the upper abdomen no acute abnormalities are demonstrated.  Review of the MIP images confirms the above findings.  IMPRESSION: 1. The patient has bilateral pulmonary involving branches of the right middle and lower lobe and left lower lobe pulmonary arteries. Positive for acute PE with CT evidence of right heartstrain (RV/LV Ratio = 1.5) consistent with at least submassive (intermediate risk)PE. The presence of right heart strain has been associated with anincreased risk of morbidity and mortality. Consultation with [place] is recommended. 2. No other acute intra thoracic abnormality is demonstrated. 3. Critical Value/emergent results were called by telephone at the time of interpretation on 06/16/2014 at 7:05 pm to Dr. Ernestina Patches , who verbally acknowledged these results.   Electronically Signed   By: David  Martinique   On: 06/16/2014 19:11    My personal review of EKG:  Assessment & Plan   1. bilateral DVTs and PEs with  right ventricular strain     Placed in  step down     Lovenox/Coumadin     Follow hypocoagulability panel      Consults pulmonary in a.m. 2. History of hyperparathyroidism     Status post inferior parathyroidectomy 3. Anxiety/GERD/kidney stone   DVT Prophylaxis Lovenox  AM Labs Ordered, also please review Full Orders    Code Status full  Disposition Plan: Home  Time spent in minutes : 31 minutes  Condition GUARDED   @SIGNATURE @

## 2014-06-16 NOTE — ED Provider Notes (Signed)
CSN: [id number]     Arrival date & time 06/16/14  1532 History   First MD Initiated Contact with Patient 06/16/14 1736     Chief Complaint  Patient presents with  . DVT  . Shortness of Breath     (Consider location/radiation/quality/duration/timing/severity/associated sxs/prior Treatment) Patient is a 58 y.o. female presenting with shortness of breath. The history is provided by the patient. No language interpreter was used.  Shortness of Breath Severity:  Moderate Duration:  5 days Timing:  Intermittent Progression:  Waxing and waning Chronicity:  New Context: activity   Relieved by:  Rest Worsened by:  Nothing tried Ineffective treatments:  None tried Associated symptoms: chest pain and cough   Associated symptoms: no abdominal pain, no diaphoresis, no fever, no headaches, no neck pain, no sore throat, no sputum production, no syncope, no vomiting and no wheezing   Associated symptoms comment:  BL calf pain Chest pain:    Chest pain quality: tightness.   Severity:  Mild   Duration:  5 days   Timing:  Intermittent   Progression:  Waxing and waning   Chronicity:  New Risk factors: hx of PE/DVT (DVT diagnosed today) and recent surgery (on 05/31/14)     Past Medical History  Diagnosis Date  . GERD (gastroesophageal reflux disease)   . Shortness of breath     with activity  . Hypercholesteremia     "mild"  . History of kidney stones   . Anxiety     "panic attacks"  . Arthritis     "probably"  . Hyperthyroidism   . Complication of anesthesia     "could not breath and heard the doctor saying she was turning blue-woke up with bruises on chest and very sore for kidney stone surgery"-anesthesia record on chart   Past Surgical History  Procedure Laterality Date  . Kidney stone surgery  08/2013  . Wrist fracture surgery Right 1995  . Cardiac catheterization  20 years ago    clear  . Cholecystectomy  09/2013  . Colonoscopy    . Finger surgery Right     ring finger-pin    . Tonsillectomy  in 20's  . Minimally invasive radioactive parathyroidectomy N/A 05/31/2014    Procedure: LEFT INFERIOR PARATHYROIDECTOMY ;  Surgeon: Fanny Skates, MD;  Location: [facility];  Service: General;  Laterality: N/A;   History reviewed. No pertinent family history. History  Substance Use Topics  . Smoking status: Former Smoker -- 1.00 packs/day for 20 years    Types: Cigarettes    Quit date: 09/29/1988  . Smokeless tobacco: Never Used  . Alcohol Use: No   OB History   Grav Para Term Preterm Abortions TAB SAB Ect Mult Living                 Review of Systems  Constitutional: Negative for fever, chills, diaphoresis, activity change, appetite change and fatigue.  HENT: Negative for congestion, facial swelling, rhinorrhea and sore throat.   Eyes: Negative for photophobia and discharge.  Respiratory: Positive for cough and shortness of breath. Negative for sputum production, chest tightness and wheezing.   Cardiovascular: Positive for chest pain. Negative for palpitations, leg swelling and syncope.  Gastrointestinal: Negative for nausea, vomiting, abdominal pain and diarrhea.  Endocrine: Negative for polydipsia and polyuria.  Genitourinary: Negative for dysuria, frequency, difficulty urinating and pelvic pain.  Musculoskeletal: Negative for arthralgias, back pain, neck pain and neck stiffness.  Skin: Negative for color change and wound.  Allergic/Immunologic: Negative for immunocompromised  state.  Neurological: Negative for facial asymmetry, weakness, numbness and headaches.  Hematological: Does not bruise/bleed easily.  Psychiatric/Behavioral: Negative for confusion and agitation.      Allergies  Review of patient's allergies indicates no known allergies.  Home Medications   Prior to Admission medications   Medication Sig Start Date End Date Taking? Authorizing Provider  aspirin 81 MG tablet Take 81 mg by mouth daily.   Yes Historical Provider, MD  Coenzyme Q10 (CO Q  10) 10 MG CAPS Take 10 mg by mouth daily.    Yes Historical Provider, MD  KRILL OIL PO Take 1 capsule by mouth daily.   Yes Historical Provider, MD  omeprazole (PRILOSEC) 20 MG capsule Take 20 mg by mouth as needed.   Yes Historical Provider, MD  rosuvastatin (CRESTOR) 10 MG tablet Take 10 mg by mouth daily.   Yes Historical Provider, MD  sertraline (ZOLOFT) 100 MG tablet Take 50 mg by mouth at bedtime.    Yes Historical Provider, MD  zinc gluconate 50 MG tablet Take 50 mg by mouth daily.   Yes Historical Provider, MD   BP 128/43  Pulse 75  Temp(Src) 98.1 F (36.7 C)  Resp 24  SpO2 98% Physical Exam  Constitutional: She is oriented to person, place, and time. She appears well-developed and well-nourished. No distress.  HENT:  Head: Normocephalic and atraumatic.  Mouth/Throat: No oropharyngeal exudate.  Eyes: Pupils are equal, round, and reactive to light.  Neck: Normal range of motion. Neck supple.  Cardiovascular: Normal rate, regular rhythm and normal heart sounds.  Exam reveals no gallop and no friction rub.   No murmur heard. Pulmonary/Chest: Effort normal and breath sounds normal. No respiratory distress. She has no wheezes. She has no rales.  Abdominal: Soft. Bowel sounds are normal. She exhibits no distension and no mass. There is no tenderness. There is no rebound and no guarding.  Musculoskeletal: Normal range of motion. She exhibits edema (1+BLLE pitting edema). She exhibits no tenderness.       Legs: Neurological: She is alert and oriented to person, place, and time.  Skin: Skin is warm and dry.  Psychiatric: She has a normal mood and affect.    ED Course  Procedures (including critical care time) Labs Review Labs Reviewed  BASIC METABOLIC PANEL - Abnormal; Notable for the following:    Glucose, Bld 105 (*)    GFR calc non Af Amer 69 (*)    GFR calc Af Amer 80 (*)    All other components within normal limits  CBC - Abnormal; Notable for the following:    WBC 11.2  (*)    All other components within normal limits  PRO B NATRIURETIC PEPTIDE  I-STAT TROPOININ, ED    Imaging Review Ct Angio Chest Pe W/cm &/or Wo Cm  06/16/2014   CLINICAL DATA:  Two and one half weeks postoperative from parathyroidectomynow with chest pain and shortness of breath and positive history of DVT  EXAM: CT ANGIOGRAPHY CHEST WITH CONTRAST  TECHNIQUE: Multidetector CT imaging of the chest was performed using the standard protocol during bolus administration of intravenous contrast. Multiplanar CT image reconstructions and MIPs were obtained to evaluate the vascular anatomy.  CONTRAST:  141mL OMNIPAQUE IOHEXOL 350 MG/ML SOLN  COMPARISON:  PA and lateral chest x-ray of today's date.  FINDINGS: There are filling defects within the right middle and lower lobe pulmonary arteries. There also filling defects within branches of the left lower lobe pulmonary arteries. The RV -LV ratio  is abnormal at 1.5. The overall cardiac size is not increased. There is no pericardial effusion. The thoracic aorta exhibits normal caliber and no evidence of a false lumen.  There is no pleural effusion. There no evidence of a pulmonary infarction or other acute pulmonary parenchymal abnormality. There is no evidence of significant hematoma in the region of the thyroid bed.  The bony thorax is unremarkable. There is calcification of the anterior longitudinal ligament of the thoracic spine. Within the upper abdomen no acute abnormalities are demonstrated.  Review of the MIP images confirms the above findings.  IMPRESSION: 1. The patient has bilateral pulmonary involving branches of the right middle and lower lobe and left lower lobe pulmonary arteries. Positive for acute PE with CT evidence of right heartstrain (RV/LV Ratio = 1.5) consistent with at least submassive (intermediate risk)PE. The presence of right heart strain has been associated with anincreased risk of morbidity and mortality. Consultation with [place] is recommended. 2. No other acute intra thoracic abnormality is demonstrated. 3. Critical Value/emergent results were called by telephone at the time of interpretation on 06/16/2014 at 7:05 pm to Dr. Ernestina Patches , who verbally acknowledged these results.   Electronically Signed   By: David  Martinique   On: 06/16/2014 19:11     EKG Interpretation   Date/Time:  Friday June 16 2014 16:26:51 EDT Ventricular Rate:  82 PR Interval:  132 QRS Duration: 76 QT Interval:  394 QTC Calculation: 460 R Axis:   14 Text Interpretation:  Normal sinus rhythm with sinus arrhythmia Normal ECG  Rate increased from prior Confirmed by DOCHERTY  MD, [place] ([id number]) on  06/16/2014 5:56:26 PM      MDM   Final diagnoses:  Pulmonary embolism  DVT, bilateral lower limbs    Pt is a 58 y.o. female with Pmhx as above who presents with about 5 days of bilateral calf pain and short of breath on exertion. She also reports mild chest tightness and nonproductive cough. She denies hemoptysis, fevers, nausea vomiting, diaphoresis. She had a parathyroidectomy about 2 weeks ago and was seen by her surgeon today who ordered DVT study which was positive for bilateral DVTs. On physical exam vital signs are stable and she is in no acute distress. She is positive tenderness to bilateral calves. EKG is no acute ischemic findings, troponin is normal CBC has a mild leukocytosis BNP is 114.7. She's refused a chest x-ray. CT angiogram chest + for BL PE w/ R heart strain.  I discussed with patient about use of Coumadin versus Xarelto. She her first Coumadin. Subcutaneous Lovenox given for bridging. Internal medicine consult for admission given bilateral PEs with right heart strain.  Spoke w/ Dr. Laren Everts who will admit to stepdown, would like to start heparin; however, cannot be started until at least 10 hrs after lovenox dosing.        Ernestina Patches, MD 06/17/14 1007

## 2014-06-17 DIAGNOSIS — I2699 Other pulmonary embolism without acute cor pulmonale: Principal | ICD-10-CM

## 2014-06-17 DIAGNOSIS — I82403 Acute embolism and thrombosis of unspecified deep veins of lower extremity, bilateral: Secondary | ICD-10-CM | POA: Diagnosis present

## 2014-06-17 DIAGNOSIS — I82409 Acute embolism and thrombosis of unspecified deep veins of unspecified lower extremity: Secondary | ICD-10-CM

## 2014-06-17 LAB — CBC
HCT: 35.4 % — ABNORMAL LOW (ref 36.0–46.0)
Hemoglobin: 11.5 g/dL — ABNORMAL LOW (ref 12.0–15.0)
MCH: 28.2 pg (ref 26.0–34.0)
MCHC: 32.5 g/dL (ref 30.0–36.0)
MCV: 86.8 fL (ref 78.0–100.0)
Platelets: 234 10*3/uL (ref 150–400)
RBC: 4.08 MIL/uL (ref 3.87–5.11)
RDW: 13.3 % (ref 11.5–15.5)
WBC: 9.4 10*3/uL (ref 4.0–10.5)

## 2014-06-17 LAB — PROTIME-INR
INR: 7.63 (ref 0.00–1.49)
INR: 1.09 (ref 0.00–1.49)
Prothrombin Time: 64.6 seconds — ABNORMAL HIGH (ref 11.6–15.2)
Prothrombin Time: 14.1 seconds (ref 11.6–15.2)

## 2014-06-17 LAB — HEPATIC FUNCTION PANEL
ALT: 14 U/L (ref 0–35)
AST: 15 U/L (ref 0–37)
Albumin: 3.5 g/dL (ref 3.5–5.2)
Alkaline Phosphatase: 85 U/L (ref 39–117)
Bilirubin, Direct: <0.2 mg/dL (ref 0.0–0.3)
Total Bilirubin: 0.7 mg/dL (ref 0.3–1.2)
Total Protein: 7.1 g/dL (ref 6.0–8.3)

## 2014-06-17 LAB — HEPARIN LEVEL (UNFRACTIONATED): Heparin Unfractionated: 0.46 IU/mL (ref 0.30–0.70)

## 2014-06-17 LAB — MRSA PCR SCREENING: MRSA by PCR: NEGATIVE

## 2014-06-17 LAB — HOMOCYSTEINE: Homocysteine: 9.1 umol/L (ref 4.0–15.4)

## 2014-06-17 LAB — ANTITHROMBIN III: AntiThromb III Func: 103 % (ref 75–120)

## 2014-06-17 MED ORDER — HEPARIN (PORCINE) IN NACL 100-0.45 UNIT/ML-% IJ SOLN
1450.0000 [IU]/h | INTRAMUSCULAR | Status: DC
  Administered 2014-06-17 – 2014-06-18 (×3): 1450 [IU]/h via INTRAVENOUS
  Filled 2014-06-17 (×5): qty 250

## 2014-06-17 NOTE — Consult Note (Signed)
Name: Michele Nunez MRN: [id number] DOB: 02-14-1956    ADMISSION DATE:  06/16/2014 CONSULTATION DATE:  06/17/2014  REFERRING MD :  Dr Tyrell Antonio PRIMARY SERVICE:  TRH  CHIEF COMPLAINT:   Dyspnea, PE,DVT  BRIEF PATIENT DESCRIPTION:  58 y.o. F adm with DVT and PE s/p parathyroidectomy two weeks previous.  Pt did receive one dose LMWH postop.  SIGNIFICANT EVENTS / STUDIES:  CT Angio 9/19: RLL RML LLL PE DVT + bilat post tib veins  LINES / TUBES: piv  CULTURES: none  ANTIBIOTICS: none  HISTORY OF PRESENT ILLNESS:   58 y.o.F STATUS post parathyroidectomy on 05-31-14 per surgery.  Note patient did receive one dose of Lovenox 40 mg postop. Patient returned to the emergency room late in the afternoon on 06/16/2014 with increasing dyspnea. Her weight today a deep venous thrombosis study was performed and showed positive bilateral tibial vein thrombus. CT angiogram done in the emergency department did reveal right middle and lower lobe pulmonary emboli along with left lower lobe pulmonary emboli. There is no significant shock state or RV strain seen. Critical care was consulted.  The patient denies any hemoptysis or chest pain. There is no lower extremity edema or pain. She has no previous history of venous thromboembolism. Is no positive family history. She has a history of hypothyroidism hypercholesterolemia history of renal stones reflux disease and obesity.  PAST MEDICAL HISTORY :  Past Medical History  Diagnosis Date  . GERD (gastroesophageal reflux disease)   . Shortness of breath     with activity  . Hypercholesteremia     "mild"  . History of kidney stones   . Anxiety     "panic attacks"  . Arthritis     "probably"  . Hyperthyroidism   . Complication of anesthesia     "could not breath and heard the doctor saying she was turning blue-woke up with bruises on chest and very sore for kidney stone surgery"-anesthesia record on chart   Past Surgical History  Procedure  Laterality Date  . Kidney stone surgery  08/2013  . Wrist fracture surgery Right 1995  . Cardiac catheterization  20 years ago    clear  . Cholecystectomy  09/2013  . Colonoscopy    . Finger surgery Right     ring finger-pin  . Tonsillectomy  in 20's  . Minimally invasive radioactive parathyroidectomy N/A 05/31/2014    Procedure: LEFT INFERIOR PARATHYROIDECTOMY ;  Surgeon: Fanny Skates, MD;  Location: [facility];  Service: General;  Laterality: N/A;   Prior to Admission medications   Medication Sig Start Date End Date Taking? Authorizing Provider  aspirin 81 MG tablet Take 81 mg by mouth daily.   Yes Historical Provider, MD  Coenzyme Q10 (CO Q 10) 10 MG CAPS Take 10 mg by mouth daily.    Yes Historical Provider, MD  KRILL OIL PO Take 1 capsule by mouth daily.   Yes Historical Provider, MD  omeprazole (PRILOSEC) 20 MG capsule Take 20 mg by mouth as needed.   Yes Historical Provider, MD  rosuvastatin (CRESTOR) 10 MG tablet Take 10 mg by mouth daily.   Yes Historical Provider, MD  sertraline (ZOLOFT) 100 MG tablet Take 50 mg by mouth at bedtime.    Yes Historical Provider, MD  zinc gluconate 50 MG tablet Take 50 mg by mouth daily.   Yes Historical Provider, MD   No Known Allergies  FAMILY HISTORY:  History reviewed. No pertinent family history. SOCIAL HISTORY:  reports that she  quit smoking about 25 years ago. Her smoking use included Cigarettes. She has a 20 pack-year smoking history. She has never used smokeless tobacco. She reports that she does not drink alcohol or use illicit drugs.  REVIEW OF SYSTEMS:   Constitutional: Negative for fever, chills, weight loss, malaise/fatigue and diaphoresis.  HENT: Negative for hearing loss, ear pain, nosebleeds, congestion, sore throat, neck pain, tinnitus and ear discharge.   Eyes: Negative for blurred vision, double vision, photophobia, pain, discharge and redness.  Respiratory: Negative for cough, hemoptysis, sputum production, shortness of  breath, wheezing and stridor.   Cardiovascular: Negative for chest pain, palpitations, orthopnea, claudication, leg swelling and PND.  Gastrointestinal: Negative for heartburn, nausea, vomiting, abdominal pain, diarrhea, constipation, blood in stool and melena.  Genitourinary: Negative for dysuria, urgency, frequency, hematuria and flank pain.  Musculoskeletal: Negative for myalgias, back pain, joint pain and falls.  Skin: Negative for itching and rash.  Neurological: Negative for dizziness, tingling, tremors, sensory change, speech change, focal weakness, seizures, loss of consciousness, weakness and headaches.  Endo/Heme/Allergies: Negative for environmental allergies and polydipsia. Does not bruise/bleed easily.  SUBJECTIVE:   VITAL SIGNS: Temp:  [98.1 F (36.7 C)] 98.1 F (36.7 C) (09/18 1630) Pulse Rate:  [64-85] 85 (09/19 0830) Resp:  [14-24] 19 (09/19 0820) BP: (104-141)/(41-71) 137/59 mmHg (09/19 0830) SpO2:  [91 %-99 %] 99 % (09/19 0830)  PHYSICAL EXAMINATION: General:  Obese female in no acute distress on room air Neuro:  Awake and alert with no focal deficits HEENT:  Surgical wound in the neck is healed Cardiovascular:  Regular rate and rhythm normal S1-S2 no murmur rub or gallop Extremities are nontender Lungs:  Clear bilateral without wheeze rale or rhonchi Abdomen:  Obese soft nontender no organomegaly Skin:  Clear   Recent Labs Lab 06/16/14 1639  NA 140  K 4.1  CL 101  CO2 25  BUN 13  CREATININE 0.90  GLUCOSE 105*    Recent Labs Lab 06/16/14 1639 06/17/14 0636  HGB 12.4 11.5*  HCT 38.9 35.4*  WBC 11.2* 9.4  PLT 261 234   Ct Angio Chest Pe W/cm &/or Wo Cm  06/16/2014   CLINICAL DATA:  Two and one half weeks postoperative from parathyroidectomynow with chest pain and shortness of breath and positive history of DVT  EXAM: CT ANGIOGRAPHY CHEST WITH CONTRAST  TECHNIQUE: Multidetector CT imaging of the chest was performed using the standard protocol  during bolus administration of intravenous contrast. Multiplanar CT image reconstructions and MIPs were obtained to evaluate the vascular anatomy.  CONTRAST:  184mL OMNIPAQUE IOHEXOL 350 MG/ML SOLN  COMPARISON:  PA and lateral chest x-ray of today's date.  FINDINGS: There are filling defects within the right middle and lower lobe pulmonary arteries. There also filling defects within branches of the left lower lobe pulmonary arteries. The RV -LV ratio is abnormal at 1.5. The overall cardiac size is not increased. There is no pericardial effusion. The thoracic aorta exhibits normal caliber and no evidence of a false lumen.  There is no pleural effusion. There no evidence of a pulmonary infarction or other acute pulmonary parenchymal abnormality. There is no evidence of significant hematoma in the region of the thyroid bed.  The bony thorax is unremarkable. There is calcification of the anterior longitudinal ligament of the thoracic spine. Within the upper abdomen no acute abnormalities are demonstrated.  Review of the MIP images confirms the above findings.  IMPRESSION: 1. The patient has bilateral pulmonary involving branches of the right middle  and lower lobe and left lower lobe pulmonary arteries. Positive for acute PE with CT evidence of right heartstrain (RV/LV Ratio = 1.5) consistent with at least submassive (intermediate risk)PE. The presence of right heart strain has been associated with anincreased risk of morbidity and mortality. Consultation with [facility] is recommended. 2. No other acute intra thoracic abnormality is demonstrated. 3. Critical Value/emergent results were called by telephone at the time of interpretation on 06/16/2014 at 7:05 pm to Dr. Ernestina Patches , who verbally acknowledged these results.   Electronically Signed   By: David  Martinique   On: 06/16/2014 19:11    ASSESSMENT / PLAN:  #1 pulmonary emboli bilateral without evidence of right ventricular strain and no  evidence of shock state with associated bilateral lower extremity deep venous thrombosis.  Plan   In this setting would continue with intravenous heparin and give   consideration to transitioning rapidly over to either refill rivaroxabin   vs eliquis  versus warfarin based on the patient's insurance   coverage.      There is no indication for TPA in this case   The patient's primary care provider in [place] can follow this   patient but pulmonary also is available to follow this patient as well    Admit to SDU warranted for 24hrs then tfr to tele.   Mariel Sleet Beeper  [phone]  Cell  [phone]  If no response or cell goes to voicemail, call beeper [phone]  Pulmonary and [place] Pager: [phone]  06/17/2014, 10:19 AM

## 2014-06-17 NOTE — ED Notes (Signed)
Breakfast ordered 

## 2014-06-17 NOTE — Progress Notes (Signed)
[place] for heparin / Coumadin Indication: pulmonary embolus  No Known Allergies  Patient Measurements: 120 kg  Vital Signs: BP: 138/66 mmHg (09/19 0820) Pulse Rate: 80 (09/19 0820)  Labs:  Recent Labs  06/16/14 1639 06/17/14 0636 06/17/14 0738  HGB 12.4 11.5*  --   HCT 38.9 35.4*  --   PLT 261 234  --   LABPROT  --  64.6* 14.1  INR  --  7.63* 1.09  CREATININE 0.90  --   --     The CrCl is unknown because both a height and weight (above a minimum accepted value) are required for this calculation.   Medical History: Past Medical History  Diagnosis Date  . GERD (gastroesophageal reflux disease)   . Shortness of breath     with activity  . Hypercholesteremia     "mild"  . History of kidney stones   . Anxiety     "panic attacks"  . Arthritis     "probably"  . Hyperthyroidism   . Complication of anesthesia     "could not breath and heard the doctor saying she was turning blue-woke up with bruises on chest and very sore for kidney stone surgery"-anesthesia record on chart    Assessment: 58 year old female to begin Lovenox and Coumadin for new PE and DVT.  Now change to heparin.  She received 120 mg sq lovenox last night at 20:51  INR today 1.09  Goal of Therapy:  Heparin level 0.3-0.7 units/ml INR 2-3 Monitor platelets by anticoagulation protocol: Yes   Plan:  Start heparin drip at 1450 units/hr with no bolus. Check heparin level 8 hours after start Coumadin 7.5 mg po daily Daily INR, HL and CBC  Thank you. Lemont Sitzmann P[place]et 06/17/2014,8:41 AM

## 2014-06-17 NOTE — Progress Notes (Signed)
[place] [facility] for heparin / Coumadin Indication: pulmonary embolus  No Known Allergies  Patient Measurements: 120 kg  Vital Signs: Temp: 98.3 F (36.8 C) (09/19 1652) Temp src: Oral (09/19 1652) BP: 132/49 mmHg (09/19 1652) Pulse Rate: 70 (09/19 1652)  Labs:  Recent Labs  06/16/14 1639 06/17/14 0636 06/17/14 0738 06/17/14 1644  HGB 12.4 11.5*  --   --   HCT 38.9 35.4*  --   --   PLT 261 234  --   --   LABPROT  --  64.6* 14.1  --   INR  --  7.63* 1.09  --   HEPARINUNFRC  --   --   --  0.46  CREATININE 0.90  --   --   --     Estimated Creatinine Clearance: 90.4 ml/min (by C-G formula based on Cr of 0.9).   Medical History: Past Medical History  Diagnosis Date  . GERD (gastroesophageal reflux disease)   . Shortness of breath     with activity  . Hypercholesteremia     "mild"  . History of kidney stones   . Anxiety     "panic attacks"  . Arthritis     "probably"  . Hyperthyroidism   . Complication of anesthesia     "could not breath and heard the doctor saying she was turning blue-woke up with bruises on chest and very sore for kidney stone surgery"-anesthesia record on chart    Assessment: 58 year old female to begin Lovenox and Coumadin for new PE and DVT.  Now change to heparin.  She received 120 mg sq lovenox last night at 20:51  INR today 1.09. Heparin came back therapeutic this PM.   Goal of Therapy:  Heparin level 0.3-0.7 units/ml INR 2-3 Monitor platelets by anticoagulation protocol: Yes   Plan:   Cont heparin drip at 1450 units/hr  Will recheck level to confirm in 6 hrs  Onnie Boer, PharmD Pager: [phone] 06/17/2014 5:48 PM

## 2014-06-17 NOTE — ED Notes (Signed)
Critical INR of 7.63. Admitting MD paged.

## 2014-06-17 NOTE — Progress Notes (Signed)
[facility] PROGRESS NOTE  Michele Nunez ZTI:[id number] DOB: 1956/06/26 DOA: 06/16/2014 PCP: Quentin Cornwall, MD  Assessment/Plan: 1-Bilateral Submassive PE, and DVT  Vital stable.  Continue with Lovenox. INR at 7 likely error.   repeated  INR at 1.09. No evidence of bleeding.  Continue with Coumadin.  Pulmonologist consulted for further anticoagulation recommendations.  Hypercoagulable panel order.   2-S/P Parathyroidectomy due to Hypercalcemia.   3-Hypercholesterolemia: Continue with Crestor.   4-Depression; continue with Zoloft.   Code Status: Full Code.  Family Communication: Care discussed with patient.  Disposition Plan: remain inpatient.    Consultants:  Pulmonary.   Procedures: Doppler LE; Bilateral: DVT noted in the posterior tibial veins. No evidence of superficial thrombosis. No Baker's cyst.     Antibiotics:  none  HPI/Subjective: Feel well, no worsening dyspnea. She presents with legs pain and SOB. She denies chest pain.  She had mammogram, pap smear, this year. Had colonoscopy, doesn't remember when.  No family history of blood clot.   Objective: Filed Vitals:   06/17/14 0730  BP: 131/46  Pulse: 73  Temp:   Resp: 17   No intake or output data in the 24 hours ending 06/17/14 0818 There were no vitals filed for this visit.  Exam:   General:  Alert in no distress.   Cardiovascular: S 1, S 2 RRR  Respiratory: CTA  Abdomen: BS present, obese, nt  Musculoskeletal: trace edema, petechial rash.   Data Reviewed: Basic Metabolic Panel:  Recent Labs Lab 06/16/14 1639  NA 140  K 4.1  CL 101  CO2 25  GLUCOSE 105*  BUN 13  CREATININE 0.90  CALCIUM 9.4   Liver Function Tests: No results found for this basename: AST, ALT, ALKPHOS, BILITOT, PROT, ALBUMIN,  in the last 168 hours No results found for this basename: LIPASE, AMYLASE,  in the last 168 hours No results found for this basename: AMMONIA,  in the last 168  hours CBC:  Recent Labs Lab 06/16/14 1639 06/17/14 0636  WBC 11.2* 9.4  HGB 12.4 11.5*  HCT 38.9 35.4*  MCV 88.2 86.8  PLT 261 234   Cardiac Enzymes: No results found for this basename: CKTOTAL, CKMB, CKMBINDEX, TROPONINI,  in the last 168 hours BNP (last 3 results)  Recent Labs  06/16/14 1639  PROBNP 114.7   CBG: No results found for this basename: GLUCAP,  in the last 168 hours  No results found for this or any previous visit (from the past 240 hour(s)).   Studies: Ct Angio Chest Pe W/cm &/or Wo Cm  06/16/2014   CLINICAL DATA:  Two and one half weeks postoperative from parathyroidectomynow with chest pain and shortness of breath and positive history of DVT  EXAM: CT ANGIOGRAPHY CHEST WITH CONTRAST  TECHNIQUE: Multidetector CT imaging of the chest was performed using the standard protocol during bolus administration of intravenous contrast. Multiplanar CT image reconstructions and MIPs were obtained to evaluate the vascular anatomy.  CONTRAST:  175mL OMNIPAQUE IOHEXOL 350 MG/ML SOLN  COMPARISON:  PA and lateral chest x-ray of today's date.  FINDINGS: There are filling defects within the right middle and lower lobe pulmonary arteries. There also filling defects within branches of the left lower lobe pulmonary arteries. The RV -LV ratio is abnormal at 1.5. The overall cardiac size is not increased. There is no pericardial effusion. The thoracic aorta exhibits normal caliber and no evidence of a false lumen.  There is no pleural effusion. There no evidence of a pulmonary infarction  or other acute pulmonary parenchymal abnormality. There is no evidence of significant hematoma in the region of the thyroid bed.  The bony thorax is unremarkable. There is calcification of the anterior longitudinal ligament of the thoracic spine. Within the upper abdomen no acute abnormalities are demonstrated.  Review of the MIP images confirms the above findings.  IMPRESSION: 1. The patient has bilateral  pulmonary involving branches of the right middle and lower lobe and left lower lobe pulmonary arteries. Positive for acute PE with CT evidence of right heartstrain (RV/LV Ratio = 1.5) consistent with at least submassive (intermediate risk)PE. The presence of right heart strain has been associated with anincreased risk of morbidity and mortality. Consultation with [facility] is recommended. 2. No other acute intra thoracic abnormality is demonstrated. 3. Critical Value/emergent results were called by telephone at the time of interpretation on 06/16/2014 at 7:05 pm to Dr. Ernestina Patches , who verbally acknowledged these results.   Electronically Signed   By: David  Martinique   On: 06/16/2014 19:11    Scheduled Meds: . atorvastatin  10 mg Oral q1800  . enoxaparin (LOVENOX) injection  120 mg Subcutaneous Q12H  . pantoprazole  40 mg Oral Daily  . sertraline  50 mg Oral QHS  . sodium chloride  3 mL Intravenous Q12H  . sodium chloride  3 mL Intravenous Q12H  . warfarin  7.5 mg Oral q1800  . warfarin   Does not apply Once  . Warfarin - Pharmacist Dosing Inpatient   Does not apply q1800   Continuous Infusions: . sodium chloride      Active Problems:   Pulmonary embolism    Time spent: 35 minutes    Tristram Milian, [facility] Pager [phone]. If 7PM-7AM, please contact night-coverage at [email or web address], password [facility] 06/17/2014, 8:18 AM  LOS: 1 day

## 2014-06-17 NOTE — ED Notes (Signed)
Critical lab result given to Dr. Tyrell Antonio via telephone. Order to redraw stat PT-INR

## 2014-06-18 DIAGNOSIS — I517 Cardiomegaly: Secondary | ICD-10-CM

## 2014-06-18 LAB — BASIC METABOLIC PANEL
Anion gap: 12 (ref 5–15)
BUN: 12 mg/dL (ref 6–23)
CO2: 26 mEq/L (ref 19–32)
Calcium: 9 mg/dL (ref 8.4–10.5)
Chloride: 103 mEq/L (ref 96–112)
Creatinine, Ser: 0.88 mg/dL (ref 0.50–1.10)
GFR calc Af Amer: 82 mL/min — ABNORMAL LOW (ref >90)
GFR calc non Af Amer: 71 mL/min — ABNORMAL LOW (ref >90)
Glucose, Bld: 106 mg/dL — ABNORMAL HIGH (ref 70–99)
Potassium: 4.7 mEq/L (ref 3.7–5.3)
Sodium: 141 mEq/L (ref 137–147)

## 2014-06-18 LAB — HEPARIN LEVEL (UNFRACTIONATED)
Heparin Unfractionated: 0.4 IU/mL (ref 0.30–0.70)
Heparin Unfractionated: 0.5 IU/mL (ref 0.30–0.70)

## 2014-06-18 LAB — CBC
HCT: 36.3 % (ref 36.0–46.0)
Hemoglobin: 11.6 g/dL — ABNORMAL LOW (ref 12.0–15.0)
MCH: 28 pg (ref 26.0–34.0)
MCHC: 32 g/dL (ref 30.0–36.0)
MCV: 87.7 fL (ref 78.0–100.0)
Platelets: 271 10*3/uL (ref 150–400)
RBC: 4.14 MIL/uL (ref 3.87–5.11)
RDW: 13.3 % (ref 11.5–15.5)
WBC: 8.4 10*3/uL (ref 4.0–10.5)

## 2014-06-18 LAB — PROTIME-INR
INR: 1.05 (ref 0.00–1.49)
Prothrombin Time: 13.7 seconds (ref 11.6–15.2)

## 2014-06-18 NOTE — Care Management Note (Unsigned)
    Page 1 of 1   06/20/2014     2:30:06 PM CARE MANAGEMENT NOTE 06/20/2014  Patient:  ASAL, TEAS   Account Number:  [id number]  Date Initiated:  06/18/2014  Documentation initiated by:  GRAVES-BIGELOW,BRENDA  Subjective/Objective Assessment:   Pt admitted for Bilateral Submassive PE, and DVT. Pt states she uses [facility] representative in [facility] Dr. Marland Kitchen [phone].     Action/Plan:   Benefits check in process. CM did call [facility] to see if medication available. [facility] closed on Sunday.  CM will provide pt with 30 day free xarelto card. Will continue to f/u.   Anticipated DC Date:  06/20/2014   Anticipated DC Plan:  [place]  CM consult  Medication Assistance      Choice offered to / List presented to:             Status of service:  Completed, signed off Medicare Important Message given?   (If response is "NO", the following Medicare IM given date fields will be blank) Date Medicare IM given:   Medicare IM given by:   Date Additional Medicare IM given:   Additional Medicare IM given by:    Discharge Disposition:  HOME/SELF CARE  Per UR Regulation:  Reviewed for med. necessity/level of care/duration of stay  If discussed at [place] of Stay Meetings, dates discussed:    Comments:  06/19/14 Ellan Lambert, RN, BSN [phone]  PT COPAY WILL BE $25- [place] IS NOT REQUIRED  06/20/14 Ellan Lambert, RN, BSN [phone] Pt given copay card for Xarelto.  This will allow her to receive med for one year free of charge.  She is appreciative of help.

## 2014-06-18 NOTE — Progress Notes (Signed)
ANTICOAGULATION CONSULT NOTE - Follow Up Consult  Pharmacy Consult for Heparin/Warfarin  Indication: pulmonary embolus  No Known Allergies  Patient Measurements: Height: 5\' 8"  (172.7 cm) Weight: 257 lb 8 oz (116.8 kg) IBW/kg (Calculated) : 63.9  Vital Signs: Temp: 98.3 F (36.8 C) (09/19 2351) Temp src: Oral (09/19 2351) BP: 103/54 mmHg (09/19 2351) Pulse Rate: 73 (09/19 2351)  Labs:  Recent Labs  06/16/14 1639 06/17/14 0636 06/17/14 0738 06/17/14 1644 06/18/14 0025  HGB 12.4 11.5*  --   --   --   HCT 38.9 35.4*  --   --   --   PLT 261 234  --   --   --   LABPROT  --  64.6* 14.1  --   --   INR  --  7.63* 1.09  --   --   HEPARINUNFRC  --   --   --  0.46 0.50  CREATININE 0.90  --   --   --   --     Estimated Creatinine Clearance: 90.4 ml/min (by C-G formula based on Cr of 0.9).    Assessment: Heparin level therapeutic x 2, other labs as above.   Goal of Therapy:  Heparin level 0.3-0.7 units/ml Monitor platelets by anticoagulation protocol: Yes   Plan:  -Continue heparin at 1450 units/hr -Daily CBC/HL -Monitor for bleeding  Narda Bonds 06/18/2014,1:31 AM

## 2014-06-18 NOTE — Progress Notes (Signed)
  Echocardiogram 2D Echocardiogram has been performed.  Michele Nunez 06/18/2014, 9:37 AM

## 2014-06-18 NOTE — Progress Notes (Signed)
Pt called RN to room with complain of new onset anterior thigh burning and numbness that woke her from her sleep.  She rates the pain 8/10 and intermittent.  Assessment finds both legs of equal temperature, warm not hot, and skin color remains WNL, no redness or other discoloration observed.  Pulses are +2 bilaterally, with +1-2 edema in right leg, it remains unchanged from the initial shift assessment.  Pt denies SOB and chest pain.  While there is numbness over the anterior right thigh, there is no loss of sensation in the distal portion of the leg.  Pt is on heparin gtt and most recent heparin level is 0.40. PRN Oxycodone 5mg  was given per prn order.   Kathline Magic NP was notified of above information, advises continued monitoring and to call with any worsening of condition.   Education and encouragement done with pt in regards to blood clots and making sure to notify staff of any new or worsening symptoms immediately; pt expresses understanding and agrees to same.

## 2014-06-18 NOTE — Progress Notes (Signed)
[facility] PROGRESS NOTE  Michele Nunez FEO:[id number] DOB: 01-Jan-1956 DOA: 06/16/2014 PCP: Quentin Cornwall, MD  Assessment/Plan: 1-Bilateral Submassive PE, and DVT  Vital stable.  Continue with heparin, if insurance will cover xarelto, will transition to xarelto.  Will discontinue coumadin, patient would like to take xarelto.  Will ask care management to help with xarelto.  Appreciate Dr Joya Gaskins help.  Hypercoagulable panel pending.   2-S/P Parathyroidectomy due to Hypercalcemia.   3-Hypercholesterolemia: Continue with Crestor.   4-Depression; continue with Zoloft.  5-Transient numbness thigh, pain, might be related to DVT. Monitor closely.    Code Status: Full Code.  Family Communication: Care discussed with patient.  Disposition Plan: remain inpatient.    Consultants:  Pulmonary.   Procedures: Doppler LE; Bilateral: DVT noted in the posterior tibial veins. No evidence of superficial thrombosis. No Baker's cyst.     Antibiotics:  none  HPI/Subjective: Feeling well, numbness, pain right thigh has resolved. She got pain medications, feels sleepy.  Objective: Filed Vitals:   06/18/14 0344  BP: 111/52  Pulse: 65  Temp: 98.3 F (36.8 C)  Resp: 21    Intake/Output Summary (Last 24 hours) at 06/18/14 0743 Last data filed at 06/18/14 0729  Gross per 24 hour  Intake    452 ml  Output    250 ml  Net    202 ml   Filed Weights   06/17/14 1340  Weight: 116.8 kg (257 lb 8 oz)    Exam:   General:  Alert in no distress.   Cardiovascular: S 1, S 2 RRR  Respiratory: CTA  Abdomen: BS present, obese, nt  Musculoskeletal: trace edema, petechial rash.   Neuro exam ; non focal.   Data Reviewed: Basic Metabolic Panel:  Recent Labs Lab 06/16/14 1639  NA 140  K 4.1  CL 101  CO2 25  GLUCOSE 105*  BUN 13  CREATININE 0.90  CALCIUM 9.4   Liver Function Tests:  Recent Labs Lab 06/17/14 0738  AST 15  ALT 14  ALKPHOS 85  BILITOT 0.7  PROT  7.1  ALBUMIN 3.5   No results found for this basename: LIPASE, AMYLASE,  in the last 168 hours No results found for this basename: AMMONIA,  in the last 168 hours CBC:  Recent Labs Lab 06/16/14 1639 06/17/14 0636 06/18/14 0325  WBC 11.2* 9.4 8.4  HGB 12.4 11.5* 11.6*  HCT 38.9 35.4* 36.3  MCV 88.2 86.8 87.7  PLT 261 234 271   Cardiac Enzymes: No results found for this basename: CKTOTAL, CKMB, CKMBINDEX, TROPONINI,  in the last 168 hours BNP (last 3 results)  Recent Labs  06/16/14 1639  PROBNP 114.7   CBG: No results found for this basename: GLUCAP,  in the last 168 hours  Recent Results (from the past 240 hour(s))  MRSA PCR SCREENING     Status: None   Collection Time    06/17/14  1:43 PM      Result Value Ref Range Status   MRSA by PCR NEGATIVE  NEGATIVE Final   Comment:            The GeneXpert MRSA Assay (FDA     approved for NASAL specimens     only), is one component of a     comprehensive MRSA colonization     surveillance program. It is not     intended to diagnose MRSA     infection nor to guide or     monitor treatment for  MRSA infections.     Studies: Ct Angio Chest Pe W/cm &/or Wo Cm  06/16/2014   CLINICAL DATA:  Two and one half weeks postoperative from parathyroidectomynow with chest pain and shortness of breath and positive history of DVT  EXAM: CT ANGIOGRAPHY CHEST WITH CONTRAST  TECHNIQUE: Multidetector CT imaging of the chest was performed using the standard protocol during bolus administration of intravenous contrast. Multiplanar CT image reconstructions and MIPs were obtained to evaluate the vascular anatomy.  CONTRAST:  169mL OMNIPAQUE IOHEXOL 350 MG/ML SOLN  COMPARISON:  PA and lateral chest x-ray of today's date.  FINDINGS: There are filling defects within the right middle and lower lobe pulmonary arteries. There also filling defects within branches of the left lower lobe pulmonary arteries. The RV -LV ratio is abnormal at 1.5. The overall  cardiac size is not increased. There is no pericardial effusion. The thoracic aorta exhibits normal caliber and no evidence of a false lumen.  There is no pleural effusion. There no evidence of a pulmonary infarction or other acute pulmonary parenchymal abnormality. There is no evidence of significant hematoma in the region of the thyroid bed.  The bony thorax is unremarkable. There is calcification of the anterior longitudinal ligament of the thoracic spine. Within the upper abdomen no acute abnormalities are demonstrated.  Review of the MIP images confirms the above findings.  IMPRESSION: 1. The patient has bilateral pulmonary involving branches of the right middle and lower lobe and left lower lobe pulmonary arteries. Positive for acute PE with CT evidence of right heartstrain (RV/LV Ratio = 1.5) consistent with at least submassive (intermediate risk)PE. The presence of right heart strain has been associated with anincreased risk of morbidity and mortality. Consultation with [place] is recommended. 2. No other acute intra thoracic abnormality is demonstrated. 3. Critical Value/emergent results were called by telephone at the time of interpretation on 06/16/2014 at 7:05 pm to Dr. Ernestina Patches , who verbally acknowledged these results.   Electronically Signed   By: David  Martinique   On: 06/16/2014 19:11    Scheduled Meds: . atorvastatin  10 mg Oral q1800  . pantoprazole  40 mg Oral Daily  . sertraline  50 mg Oral QHS  . sodium chloride  3 mL Intravenous Q12H  . sodium chloride  3 mL Intravenous Q12H  . warfarin  7.5 mg Oral q1800  . warfarin   Does not apply Once  . Warfarin - Pharmacist Dosing Inpatient   Does not apply q1800   Continuous Infusions: . heparin 1,450 Units/hr (06/17/14 2310)    Principal Problem:   Pulmonary embolism Active Problems:   DVT, bilateral lower limbs    Time spent: 35 minutes    [name], [facility] Pager  [phone]. If 7PM-7AM, please contact night-coverage at [email or web address], password [facility] 06/18/2014, 7:43 AM  LOS: 2 days

## 2014-06-19 DIAGNOSIS — E210 Primary hyperparathyroidism: Secondary | ICD-10-CM

## 2014-06-19 LAB — HEPARIN LEVEL (UNFRACTIONATED): Heparin Unfractionated: 0.4 IU/mL (ref 0.30–0.70)

## 2014-06-19 LAB — PROTEIN C, TOTAL: Protein C, Total: 102 % (ref 72–160)

## 2014-06-19 LAB — CBC
HCT: 35.4 % — ABNORMAL LOW (ref 36.0–46.0)
Hemoglobin: 11.2 g/dL — ABNORMAL LOW (ref 12.0–15.0)
MCH: 27.8 pg (ref 26.0–34.0)
MCHC: 31.6 g/dL (ref 30.0–36.0)
MCV: 87.8 fL (ref 78.0–100.0)
Platelets: 272 10*3/uL (ref 150–400)
RBC: 4.03 MIL/uL (ref 3.87–5.11)
RDW: 13.2 % (ref 11.5–15.5)
WBC: 6.9 10*3/uL (ref 4.0–10.5)

## 2014-06-19 LAB — LUPUS ANTICOAGULANT PANEL
DRVVT: 33.9 secs (ref <42.9)
Lupus Anticoagulant: NOT DETECTED
PTT Lupus Anticoagulant: 48.9 secs — ABNORMAL HIGH (ref 28.0–43.0)
PTTLA 4:1 Mix: 49.3 secs — ABNORMAL HIGH (ref 28.0–43.0)
PTTLA Confirmation: 6.9 secs (ref <8.0)

## 2014-06-19 LAB — PROTEIN C ACTIVITY: Protein C Activity: 140 % — ABNORMAL HIGH (ref 75–133)

## 2014-06-19 LAB — BETA-2-GLYCOPROTEIN I ABS, IGG/M/A
Beta-2 Glyco I IgG: 6 G Units (ref <20)
Beta-2-Glycoprotein I IgA: 8 A Units (ref <20)
Beta-2-Glycoprotein I IgM: 6 M Units (ref <20)

## 2014-06-19 LAB — PROTIME-INR
INR: 1.2 (ref 0.00–1.49)
Prothrombin Time: 15.2 seconds (ref 11.6–15.2)

## 2014-06-19 LAB — CARDIOLIPIN ANTIBODIES, IGG, IGM, IGA
Anticardiolipin IgA: 8 APL U/mL — ABNORMAL LOW (ref <22)
Anticardiolipin IgG: 4 GPL U/mL — ABNORMAL LOW (ref <23)
Anticardiolipin IgM: 47 MPL U/mL — ABNORMAL HIGH (ref <11)

## 2014-06-19 LAB — PROTEIN S, TOTAL: Protein S Ag, Total: 102 % (ref 60–150)

## 2014-06-19 LAB — PROTEIN S ACTIVITY: Protein S Activity: 91 % (ref 69–129)

## 2014-06-19 MED ORDER — RIVAROXABAN 20 MG PO TABS: 20.0000 mg | ORAL_TABLET | Freq: Every day | ORAL | Status: DC

## 2014-06-19 MED ORDER — RIVAROXABAN 15 MG PO TABS
15.0000 mg | ORAL_TABLET | Freq: Two times a day (BID) | ORAL | Status: DC
  Administered 2014-06-19 – 2014-06-20 (×3): 15 mg via ORAL
  Filled 2014-06-19 (×5): qty 1

## 2014-06-19 NOTE — Progress Notes (Signed)
Gen. Surgery:  Social visit . I appreciate all the care provided by [facility] and critical care and nursing. Complained of bilateral calf pain when I saw her in the office on September 18. Duplex scan showed a lower extremity DVT and CT angiogram bilateral pulmonary emboli. Admitted through ED.   Underwent minimally invasive parathyroidectomy for parathyroid adenoma and primary hyperparathyroidism on September 2. Denies paresthesias. Neck incision looks good Chvostek's sign Negative bilaterally Calcium level 9.0 on 06/18/2014. Has not taken calcium supplementation since admission.  Rec:      bmet tomorrow    If calcium normal, then we can assume that postop hypoparathyroidism has resolved and no specific calcium supplementation is required.    She is asking when she can go back to work. She has a very hot, very strenuous job at the [employer] in [place]. I told her this would be determined by the outpatient treatment plan for her VTE.      I've asked her to see me in the office in about 3 weeks.   Edsel Petrin. Dalbert Batman, M.D., [facility] General and Minimally invasive Surgery Breast and Colorectal Surgery Office:   [phone] Pager:   [phone]

## 2014-06-19 NOTE — Progress Notes (Signed)
[facility] PROGRESS NOTE  Michele Nunez KZS:[id number] DOB: [birth year] DOA: 06/16/2014 PCP: Quentin Cornwall, MD  Assessment/Plan: 1-Bilateral Submassive PE, and DVT  Vital stable.  Continue with heparin, if insurance will cover xarelto, will transition to [place] today.  Will discontinue coumadin, patient would like to take xarelto.  Will ask care management to help with xarelto.  Appreciate Dr Joya Gaskins help.  Hypercoagulable panel: pending.   2-S/P Parathyroidectomy due to Hypercalcemia.   3-Hypercholesterolemia: Continue with Crestor.   4-Depression; continue with Zoloft.  5-Transient numbness thigh, pain, might be related to DVT. Monitor closely.     Code Status: Full Code.  Family Communication: Care discussed with patient.  Disposition Plan: transfer to telemetry, might be able to be discharge 9-22.    Consultants:  Pulmonary.   Procedures: Doppler LE; Bilateral: DVT noted in the posterior tibial veins. No evidence of superficial thrombosis. No Baker's cyst.     Antibiotics:  none  HPI/Subjective: Feeling well, complaining of leg pain. No worsening dyspnea. Had a BM.   Objective: Filed Vitals:   06/19/14 0417  BP: 124/46  Pulse: 72  Temp: 98 F (36.7 C)  Resp: 17    Intake/Output Summary (Last 24 hours) at 06/19/14 0748 Last data filed at 06/19/14 0008  Gross per 24 hour  Intake    878 ml  Output    600 ml  Net    278 ml   Filed Weights   06/17/14 1340 06/19/14 0417  Weight: 116.8 kg (257 lb 8 oz) 117.3 kg (258 lb 9.6 oz)    Exam:   General:  Alert in no distress.   Cardiovascular: S 1, S 2 RRR  Respiratory: CTA  Abdomen: BS present, obese, nt  Musculoskeletal: trace edema, petechial rash.   Neuro exam ; non focal.   Data Reviewed: Basic Metabolic Panel:  Recent Labs Lab 06/16/14 1639 06/18/14 0826  NA 140 141  K 4.1 4.7  CL 101 103  CO2 25 26  GLUCOSE 105* 106*  BUN 13 12  CREATININE 0.90 0.88  CALCIUM 9.4 9.0    Liver Function Tests:  Recent Labs Lab 06/17/14 0738  AST 15  ALT 14  ALKPHOS 85  BILITOT 0.7  PROT 7.1  ALBUMIN 3.5   No results found for this basename: LIPASE, AMYLASE,  in the last 168 hours No results found for this basename: AMMONIA,  in the last 168 hours CBC:  Recent Labs Lab 06/16/14 1639 06/17/14 0636 06/18/14 0325 06/19/14 0356  WBC 11.2* 9.4 8.4 6.9  HGB 12.4 11.5* 11.6* 11.2*  HCT 38.9 35.4* 36.3 35.4*  MCV 88.2 86.8 87.7 87.8  PLT 261 234 271 272   Cardiac Enzymes: No results found for this basename: CKTOTAL, CKMB, CKMBINDEX, TROPONINI,  in the last 168 hours BNP (last 3 results)  Recent Labs  06/16/14 1639  PROBNP 114.7   CBG: No results found for this basename: GLUCAP,  in the last 168 hours  Recent Results (from the past 240 hour(s))  MRSA PCR SCREENING     Status: None   Collection Time    06/17/14  1:43 PM      Result Value Ref Range Status   MRSA by PCR NEGATIVE  NEGATIVE Final   Comment:            The GeneXpert MRSA Assay (FDA     approved for NASAL specimens     only), is one component of a     comprehensive MRSA colonization  surveillance program. It is not     intended to diagnose MRSA     infection nor to guide or     monitor treatment for     MRSA infections.     Studies: No results found.  Scheduled Meds: . atorvastatin  10 mg Oral q1800  . pantoprazole  40 mg Oral Daily  . sertraline  50 mg Oral QHS  . sodium chloride  3 mL Intravenous Q12H  . sodium chloride  3 mL Intravenous Q12H   Continuous Infusions: . heparin 1,450 Units/hr (06/18/14 1839)    Principal Problem:   Pulmonary embolism Active Problems:   DVT, bilateral lower limbs    Time spent: 30 minutes    Michele Nunez, [facility] Pager [phone]. If 7PM-7AM, please contact night-coverage at [email or web address], password [facility] 06/19/2014, 7:48 AM  LOS: 3 days

## 2014-06-19 NOTE — Progress Notes (Signed)
Name: Michele Nunez MRN: [id number] DOB: Oct 19, 1955    ADMISSION DATE:  06/16/2014 CONSULTATION DATE:  06/19/2014  REFERRING MD :  Dr Tyrell Antonio PRIMARY SERVICE:  TRH  CHIEF COMPLAINT:   Dyspnea, PE,DVT  BRIEF PATIENT DESCRIPTION:  58 y.o. F adm with DVT and PE s/p parathyroidectomy two weeks previous.  Pt did receive one dose LMWH postop.  SIGNIFICANT EVENTS / STUDIES:  CT Angio 9/19: RLL RML LLL PE DVT + bilat post tib veins  LINES / TUBES: piv  CULTURES: none  ANTIBIOTICS: none  HISTORY OF PRESENT ILLNESS:   58 y.o.F STATUS post parathyroidectomy on 05-31-14 per surgery.  Note patient did receive one dose of Lovenox 40 mg postop. Patient returned to the emergency room late in the afternoon on 06/16/2014 with increasing dyspnea. Her weight today a deep venous thrombosis study was performed and showed positive bilateral tibial vein thrombus. CT angiogram done in the emergency department did reveal right middle and lower lobe pulmonary emboli along with left lower lobe pulmonary emboli. There is no significant shock state or RV strain seen. Critical care was consulted.  The patient denies any hemoptysis or chest pain. There is no lower extremity edema or pain. She has no previous history of venous thromboembolism. Is no positive family history. She has a history of hypothyroidism hypercholesterolemia history of renal stones reflux disease and obesity.  SUBJECTIVE\OVERNIGHT EVENTS: No acute overnight   PAST MEDICAL HISTORY :  Past Medical History  Diagnosis Date  . GERD (gastroesophageal reflux disease)   . Shortness of breath     with activity  . Hypercholesteremia     "mild"  . History of kidney stones   . Anxiety     "panic attacks"  . Arthritis     "probably"  . Hyperthyroidism   . Complication of anesthesia     "could not breath and heard the doctor saying she was turning blue-woke up with bruises on chest and very sore for kidney stone surgery"-anesthesia  record on chart   Past Surgical History  Procedure Laterality Date  . Kidney stone surgery  08/2013  . Wrist fracture surgery Right 1995  . Cardiac catheterization  20 years ago    clear  . Cholecystectomy  09/2013  . Colonoscopy    . Finger surgery Right     ring finger-pin  . Tonsillectomy  in 20's  . Minimally invasive radioactive parathyroidectomy N/A 05/31/2014    Procedure: LEFT INFERIOR PARATHYROIDECTOMY ;  Surgeon: Fanny Skates, MD;  Location: [facility];  Service: General;  Laterality: N/A;   Prior to Admission medications   Medication Sig Start Date End Date Taking? Authorizing Provider  aspirin 81 MG tablet Take 81 mg by mouth daily.   Yes Historical Provider, MD  Coenzyme Q10 (CO Q 10) 10 MG CAPS Take 10 mg by mouth daily.    Yes Historical Provider, MD  KRILL OIL PO Take 1 capsule by mouth daily.   Yes Historical Provider, MD  omeprazole (PRILOSEC) 20 MG capsule Take 20 mg by mouth as needed.   Yes Historical Provider, MD  rosuvastatin (CRESTOR) 10 MG tablet Take 10 mg by mouth daily.   Yes Historical Provider, MD  sertraline (ZOLOFT) 100 MG tablet Take 50 mg by mouth at bedtime.    Yes Historical Provider, MD  zinc gluconate 50 MG tablet Take 50 mg by mouth daily.   Yes Historical Provider, MD   No Known Allergies  FAMILY HISTORY:  History reviewed. No pertinent family  history. SOCIAL HISTORY:  reports that she quit smoking about 25 years ago. Her smoking use included Cigarettes. She has a 20 pack-year smoking history. She has never used smokeless tobacco. She reports that she does not drink alcohol or use illicit drugs.  SUBJECTIVE:   VITAL SIGNS: Temp:  [97.9 F (36.6 C)-98.7 F (37.1 C)] 98.7 F (37.1 C) (09/21 1420) Pulse Rate:  [63-87] 72 (09/21 1420) Resp:  [16-29] 18 (09/21 1420) BP: (120-139)/(46-69) 139/69 mmHg (09/21 1420) SpO2:  [94 %-100 %] 97 % (09/21 1420) Weight:  [258 lb 9.6 oz (117.3 kg)] 258 lb 9.6 oz (117.3 kg) (09/21 0417)  PHYSICAL  EXAMINATION: General:  Obese female in no acute distress on room air Neuro:  Awake and alert with no focal deficits HEENT:  Surgical wound in the neck is healed Cardiovascular:  Regular rate and rhythm normal S1-S2 no murmur rub or gallop Extremities are nontender Lungs:  Clear bilateral without wheeze rale or rhonchi Abdomen:  Obese soft nontender no organomegaly Skin:  Clear   Recent Labs Lab 06/16/14 1639 06/18/14 0826  NA 140 141  K 4.1 4.7  CL 101 103  CO2 25 26  BUN 13 12  CREATININE 0.90 0.88  GLUCOSE 105* 106*    Recent Labs Lab 06/17/14 0636 06/18/14 0325 06/19/14 0356  HGB 11.5* 11.6* 11.2*  HCT 35.4* 36.3 35.4*  WBC 9.4 8.4 6.9  PLT 234 271 272   No results found.  ASSESSMENT / PLAN:  #1 pulmonary emboli bilateral without evidence of right ventricular strain and no evidence of shock state with associated bilateral lower extremity deep venous thrombosis.  Plan Transition rapidly over to either refill rivaroxabin vs eliquis. There is no indication for TPA in this case The patient's primary care provider in [place] can follow this patient but pulmonary also is available to follow this patient as well.  Patient can follow up with Dr. Lyda Jester 2-3 weeks after discharge.   Consult time - 35 mins  Thank you for consulting PCCM, we will signoff at this time.   Vilinda Boehringer, MD [facility] Pager [phone] On Call Pager [phone]

## 2014-06-19 NOTE — Progress Notes (Signed)
Pharmacy Consult --> Heparin/Coumadin to Xarelto  PE/ DVT No bleeding noted  Plan: DC Heparin Start Xarelto 15 mg po BID x 3 weeks then 20 mg daily  Thank you. Anette Guarneri, PharmD [phone]

## 2014-06-19 NOTE — Discharge Instructions (Signed)
Information on my medicine - XARELTO® (rivaroxaban) ° °This medication education was reviewed with me or my healthcare representative as part of my discharge preparation.  The pharmacist that spoke with me during my hospital stay was:  Alanie Syler Kay, RPH ° °WHY WAS XARELTO® PRESCRIBED FOR YOU? °Xarelto® was prescribed to treat blood clots that may have been found in the veins of your legs (deep vein thrombosis) or in your lungs (pulmonary embolism) and to reduce the risk of them occurring again. ° °What do you need to know about Xarelto®? °The starting dose is one 15 mg tablet taken TWICE daily with food for the FIRST 21 DAYS then  the dose is changed to one 20 mg tablet taken ONCE A DAY with your evening meal. ° °DO NOT stop taking Xarelto® without talking to the health care provider who prescribed the medication.  Refill your prescription for 20 mg tablets before you run out. ° °After discharge, you should have regular check-up appointments with your healthcare provider that is prescribing your Xarelto®.  In the future your dose may need to be changed if your kidney function changes by a significant amount. ° °What do you do if you miss a dose? °If you are taking Xarelto® TWICE DAILY and you miss a dose, take it as soon as you remember. You may take two 15 mg tablets (total 30 mg) at the same time then resume your regularly scheduled 15 mg twice daily the next day. ° °If you are taking Xarelto® ONCE DAILY and you miss a dose, take it as soon as you remember on the same day then continue your regularly scheduled once daily regimen the next day. Do not take two doses of Xarelto® at the same time.  ° °Important Safety Information °Xarelto® is a blood thinner medicine that can cause bleeding. You should call your healthcare provider right away if you experience any of the following: °? Bleeding from an injury or your nose that does not stop. °? Unusual colored urine (red or dark brown) or unusual colored stools  (red or black). °? Unusual bruising for unknown reasons. °? A serious fall or if you hit your head (even if there is no bleeding). ° °Some medicines may interact with Xarelto® and might increase your risk of bleeding while on Xarelto®. To help avoid this, consult your healthcare provider or pharmacist prior to using any new prescription or non-prescription medications, including herbals, vitamins, non-steroidal anti-inflammatory drugs (NSAIDs) and supplements. ° °This website has more information on Xarelto®: www.xarelto.com. °

## 2014-06-20 LAB — BASIC METABOLIC PANEL
Anion gap: 11 (ref 5–15)
BUN: 12 mg/dL (ref 6–23)
CO2: 25 mEq/L (ref 19–32)
Calcium: 9.2 mg/dL (ref 8.4–10.5)
Chloride: 103 mEq/L (ref 96–112)
Creatinine, Ser: 0.9 mg/dL (ref 0.50–1.10)
GFR calc Af Amer: 80 mL/min — ABNORMAL LOW (ref >90)
GFR calc non Af Amer: 69 mL/min — ABNORMAL LOW (ref >90)
Glucose, Bld: 104 mg/dL — ABNORMAL HIGH (ref 70–99)
Potassium: 4.4 mEq/L (ref 3.7–5.3)
Sodium: 139 mEq/L (ref 137–147)

## 2014-06-20 LAB — PROTIME-INR
INR: 1.47 (ref 0.00–1.49)
Prothrombin Time: 17.8 seconds — ABNORMAL HIGH (ref 11.6–15.2)

## 2014-06-20 MED ORDER — OXYCODONE HCL 5 MG PO TABS: 5.0000 mg | ORAL_TABLET | ORAL | Status: DC | PRN

## 2014-06-20 MED ORDER — RIVAROXABAN 20 MG PO TABS: 20.0000 mg | ORAL_TABLET | Freq: Every day | ORAL | Status: DC

## 2014-06-20 MED ORDER — RIVAROXABAN 15 MG PO TABS: 15.0000 mg | ORAL_TABLET | Freq: Two times a day (BID) | ORAL | Status: DC

## 2014-06-20 NOTE — Progress Notes (Signed)
Discharge instructions given. Pf verbalized understanding and all questions were answered.

## 2014-06-20 NOTE — Discharge Summary (Signed)
Physician Discharge Summary  Michele Nunez VQM:[id number] DOB: November 05, 1955 DOA: 06/16/2014  PCP: Quentin Cornwall, MD  Admit date: 06/16/2014 Discharge date: 06/20/2014  Time spent: 35 minutes  Recommendations for Outpatient Follow-up:  1. Needs B-met to follow calcium level.   Discharge Diagnoses:    Sub -massive Pulmonary embolism   DVT, bilateral lower limbs   Discharge Condition: Stable.   Diet recommendation: Heart Healthy  Filed Weights   06/17/14 1340 06/19/14 0417  Weight: 116.8 kg (257 lb 8 oz) 117.3 kg (258 lb 9.6 oz)    History of present illness:  Michele Nunez is a 58 y.o. female, with past medical history significant for left parathyroidectomy 2-1/2 weeks ago presenting with few days history of bilateral leg pains and shortness of breath. Lower extremity Dopplers were positive for DVTs and a CAT scan of the chest was positive for PE's . No history or family history of DVTs or hypercoagulable state.   Hospital Course:  1-Bilateral Submassive PE, and DVT  Vital stable.  She was started on IN heparin Gtt. She was subsequently  transition to [place].   Appreciate Dr Joya Gaskins help.  Hypercoagulable panel: pending.   2-S/P Parathyroidectomy due to Hypercalcemia. Calcium normal.  3-Hypercholesterolemia: Continue with Crestor.  4-Depression; continue with Zoloft.  5-Transient numbness thigh, pain, might be related to DVT. Monitor closely.    Procedures: Doppler LE; Bilateral: DVT noted in the posterior tibial veins. No evidence of superficial thrombosis. No Baker's cyst.    Consultations:  Pulmonologist  Discharge Exam: Filed Vitals:   06/20/14 0813  BP: 133/63  Pulse: 68  Temp: 98.3 F (36.8 C)  Resp: 20    General: Alert in no distress.  Cardiovascular: S 1, S 2 RRR Respiratory: CTA  Discharge Instructions You were cared for by a hospitalist during your hospital stay. If you have any questions about your discharge medications or the care you  received while you were in the hospital after you are discharged, you can call the unit and asked to speak with the hospitalist on call if the hospitalist that took care of you is not available. Once you are discharged, your primary care physician will handle any further medical issues. Please note that NO REFILLS for any discharge medications will be authorized once you are discharged, as it is imperative that you return to your primary care physician (or establish a relationship with a primary care physician if you do not have one) for your aftercare needs so that they can reassess your need for medications and monitor your lab values.  Discharge Instructions   Diet - low sodium heart healthy    Complete by:  As directed      Increase activity slowly    Complete by:  As directed           Current Discharge Medication List    START taking these medications   Details  oxyCODONE (OXY IR/ROXICODONE) 5 MG immediate release tablet Take 1 tablet (5 mg total) by mouth every 4 (four) hours as needed for moderate pain. Qty: 30 tablet, Refills: 0    !! Rivaroxaban (XARELTO) 15 MG TABS tablet Take 1 tablet (15 mg total) by mouth 2 (two) times daily with a meal. Qty: 41 tablet, Refills: 0    !! rivaroxaban (XARELTO) 20 MG TABS tablet Take 1 tablet (20 mg total) by mouth daily with supper. Qty: 30 tablet, Refills: 0     !! - Potential duplicate medications found. Please discuss with provider.  CONTINUE these medications which have NOT CHANGED   Details  omeprazole (PRILOSEC) 20 MG capsule Take 20 mg by mouth as needed.    rosuvastatin (CRESTOR) 10 MG tablet Take 10 mg by mouth daily.    sertraline (ZOLOFT) 100 MG tablet Take 50 mg by mouth at bedtime.       STOP taking these medications     aspirin 81 MG tablet      Coenzyme Q10 (CO Q 10) 10 MG CAPS      KRILL OIL PO      zinc gluconate 50 MG tablet        No Known Allergies Follow-up Information   Follow up with Quentin Cornwall,  MD.   Specialty:  Family Medicine   Contact information:   [address] [place] [address] [phone]       Follow up with Asencion Noble, MD In 3 weeks.   Specialty:  Pulmonary Disease   Contact information:   [place] [address] [phone]        The results of significant diagnostics from this hospitalization (including imaging, microbiology, ancillary and laboratory) are listed below for reference.    Significant Diagnostic Studies: Ct Angio Chest Pe W/cm &/or Wo Cm  06/16/2014   CLINICAL DATA:  Two and one half weeks postoperative from parathyroidectomynow with chest pain and shortness of breath and positive history of DVT  EXAM: CT ANGIOGRAPHY CHEST WITH CONTRAST  TECHNIQUE: Multidetector CT imaging of the chest was performed using the standard protocol during bolus administration of intravenous contrast. Multiplanar CT image reconstructions and MIPs were obtained to evaluate the vascular anatomy.  CONTRAST:  153m OMNIPAQUE IOHEXOL 350 MG/ML SOLN  COMPARISON:  PA and lateral chest x-ray of today's date.  FINDINGS: There are filling defects within the right middle and lower lobe pulmonary arteries. There also filling defects within branches of the left lower lobe pulmonary arteries. The RV -LV ratio is abnormal at 1.5. The overall cardiac size is not increased. There is no pericardial effusion. The thoracic aorta exhibits normal caliber and no evidence of a false lumen.  There is no pleural effusion. There no evidence of a pulmonary infarction or other acute pulmonary parenchymal abnormality. There is no evidence of significant hematoma in the region of the thyroid bed.  The bony thorax is unremarkable. There is calcification of the anterior longitudinal ligament of the thoracic spine. Within the upper abdomen no acute abnormalities are demonstrated.  Review of the MIP images confirms the above findings.  IMPRESSION: 1. The patient has bilateral  pulmonary involving branches of the right middle and lower lobe and left lower lobe pulmonary arteries. Positive for acute PE with CT evidence of right heartstrain (RV/LV Ratio = 1.5) consistent with at least submassive (intermediate risk)PE. The presence of right heart strain has been associated with anincreased risk of morbidity and mortality. Consultation with [facility] recommended. 2. No other acute intra thoracic abnormality is demonstrated. 3. Critical Value/emergent results were called by telephone at the time of interpretation on 06/16/2014 at 7:05 pm to Dr. MErnestina Patches, who verbally acknowledged these results.   Electronically Signed   By: David  JMartinique  On: 06/16/2014 19:11    Microbiology: Recent Results (from the past 240 hour(s))  MRSA PCR SCREENING     Status: None   Collection Time    06/17/14  1:43 PM      Result Value Ref Range Status  MRSA by PCR NEGATIVE  NEGATIVE Final   Comment:            The GeneXpert MRSA Assay (FDA     approved for NASAL specimens     only), is one component of a     comprehensive MRSA colonization     surveillance program. It is not     intended to diagnose MRSA     infection nor to guide or     monitor treatment for     MRSA infections.     Labs: Basic Metabolic Panel:  Recent Labs Lab 06/16/14 1639 06/18/14 0826 06/20/14 0500  NA 140 141 139  K 4.1 4.7 4.4  CL 101 103 103  CO2 25 26 25   GLUCOSE 105* 106* 104*  BUN 13 12 12   CREATININE 0.90 0.88 0.90  CALCIUM 9.4 9.0 9.2   Liver Function Tests:  Recent Labs Lab 06/17/14 0738  AST 15  ALT 14  ALKPHOS 85  BILITOT 0.7  PROT 7.1  ALBUMIN 3.5   No results found for this basename: LIPASE, AMYLASE,  in the last 168 hours No results found for this basename: AMMONIA,  in the last 168 hours CBC:  Recent Labs Lab 06/16/14 1639 06/17/14 0636 06/18/14 0325 06/19/14 0356  WBC 11.2* 9.4 8.4 6.9  HGB 12.4 11.5* 11.6* 11.2*  HCT 38.9 35.4* 36.3  35.4*  MCV 88.2 86.8 87.7 87.8  PLT 261 234 271 272   Cardiac Enzymes: No results found for this basename: CKTOTAL, CKMB, CKMBINDEX, TROPONINI,  in the last 168 hours BNP: BNP (last 3 results)  Recent Labs  06/16/14 1639  PROBNP 114.7   CBG: No results found for this basename: GLUCAP,  in the last 168 hours     Signed:  Niel Hummer A  [facility] 06/20/2014, 9:45 AM

## 2014-06-22 LAB — PROTHROMBIN GENE MUTATION

## 2014-06-22 LAB — FACTOR 5 LEIDEN

## 2014-07-03 ENCOUNTER — Other Ambulatory Visit (INDEPENDENT_AMBULATORY_CARE_PROVIDER_SITE_OTHER): Payer: Self-pay | Admitting: General Surgery

## 2014-07-04 ENCOUNTER — Encounter: Payer: Self-pay | Admitting: Internal Medicine

## 2014-07-04 ENCOUNTER — Other Ambulatory Visit (INDEPENDENT_AMBULATORY_CARE_PROVIDER_SITE_OTHER): Payer: BC Managed Care – PPO

## 2014-07-04 ENCOUNTER — Encounter: Payer: Self-pay | Admitting: *Deleted

## 2014-07-04 ENCOUNTER — Ambulatory Visit (INDEPENDENT_AMBULATORY_CARE_PROVIDER_SITE_OTHER): Payer: BC Managed Care – PPO | Admitting: Internal Medicine

## 2014-07-04 VITALS — BP 152/80 | HR 62 | Temp 98.0°F | Ht 68.0 in | Wt 268.0 lb

## 2014-07-04 DIAGNOSIS — I2699 Other pulmonary embolism without acute cor pulmonale: Secondary | ICD-10-CM

## 2014-07-04 DIAGNOSIS — R0600 Dyspnea, unspecified: Secondary | ICD-10-CM

## 2014-07-04 DIAGNOSIS — E785 Hyperlipidemia, unspecified: Secondary | ICD-10-CM

## 2014-07-04 LAB — CBC WITH DIFFERENTIAL/PLATELET
Basophils Absolute: 0 10*3/uL (ref 0.0–0.1)
Basophils Relative: 0.4 % (ref 0.0–3.0)
Eosinophils Absolute: 0.2 10*3/uL (ref 0.0–0.7)
Eosinophils Relative: 2.2 % (ref 0.0–5.0)
HCT: 37.9 % (ref 36.0–46.0)
Hemoglobin: 12.6 g/dL (ref 12.0–15.0)
Lymphocytes Relative: 32.9 % (ref 12.0–46.0)
Lymphs Abs: 3.2 10*3/uL (ref 0.7–4.0)
MCHC: 33.1 g/dL (ref 30.0–36.0)
MCV: 85.2 fl (ref 78.0–100.0)
Monocytes Absolute: 0.7 10*3/uL (ref 0.1–1.0)
Monocytes Relative: 6.8 % (ref 3.0–12.0)
Neutro Abs: 5.6 10*3/uL (ref 1.4–7.7)
Neutrophils Relative %: 57.7 % (ref 43.0–77.0)
Platelets: 364 10*3/uL (ref 150.0–400.0)
RBC: 4.45 Mil/uL (ref 3.87–5.11)
RDW: 13.7 % (ref 11.5–15.5)
WBC: 9.7 10*3/uL (ref 4.0–10.5)

## 2014-07-04 LAB — BRAIN NATRIURETIC PEPTIDE: Pro B Natriuretic peptide (BNP): 38 pg/mL (ref 0.0–100.0)

## 2014-07-04 NOTE — Patient Instructions (Signed)
Stop crestor x two weeks to see what difference it makes with your muscle aches   Please remember to go to the  lab department downstairs for your tests - we will call you with the results when they are available.    No work until return here in one month to regroup with Dr Joya Gaskins

## 2014-07-04 NOTE — Progress Notes (Signed)
Subjective:     Patient ID: Michele Nunez, female   DOB: 23-Sep-1956 MRN: [id number]  HPI  68 yowf quit smoking 1990s referred to pulmonary s/p PE p parathyroidectomy    Admit date: 06/16/2014  Discharge date: 06/20/2014   Recommendations for Outpatient Follow-up:  1. Needs B-met to follow calcium level.  Discharge Diagnoses:  Sub -massive Pulmonary embolism  DVT, bilateral lower limbs  Pt s/p  left parathyroidectomy 2-1/2 weeks pta presenting with few days history of bilateral leg pains and shortness of breath. Lower extremity Dopplers were positive for DVTs and a CAT scan of the chest was positive for PE's . No history or family history of DVTs or hypercoagulable state.  Hospital Course:  1-Bilateral Submassive PE, and DVT   She was started on IV heparin Gtt. She was subsequently transition to [place].   Hypercoagulable panel: pending.  2-S/P Parathyroidectomy due to Hypercalcemia. Calcium normal.  3-Hypercholesterolemia: Continue with Crestor.  4-Depression; continue with Zoloft.  5-Transient numbness thigh, pain, might be related to DVT. Monitor closely.  Procedures:  Doppler LE; Bilateral: DVT noted in the posterior tibial veins. No evidence of superficial thrombosis. No Baker's cyst.      07/04/2014 post hosp f/u  ov/Michele Nunez re: pe Chief Complaint  Patient presents with  . HFU    Pt states that her breathing has improved some, but not back at her normal baseline. She c/o HA since starting on Xarelto and wonders if this is due to taking med.   Not limited by breathing from desired activities   Main complaint is diffuse muscle aches "like I've been run over" x weeks HA worse as day goes on s viz/ neuro c/os  No obvious day to day or daytime variabilty or assoc chronic cough or cp or chest tightness, subjective wheeze overt sinus or hb symptoms. No unusual exp hx or h/o childhood pna/ asthma or knowledge of premature birth.  Sleeping ok without nocturnal  or early am  exacerbation  of respiratory  c/o's or need for noct saba. Also denies any obvious fluctuation of symptoms with weather or environmental changes or other aggravating or alleviating factors except as outlined above   Current Medications, Allergies, Complete Past Medical History, Past Surgical History, Family History, and Social History were reviewed in [employer] record.  ROS  The following are not active complaints unless bolded sore throat, dysphagia, dental problems, itching, sneezing,  nasal congestion or excess/ purulent secretions, ear ache,   fever, chills, sweats, unintended wt loss, pleuritic or exertional cp, hemoptysis,  orthopnea pnd or leg swelling, presyncope, palpitations, heartburn, abdominal pain, anorexia, nausea, vomiting, diarrhea  or change in bowel or urinary habits, change in stools or urine, dysuria,hematuria,  rash, arthralgias, myalgias  visual complaints, headache, numbness weakness or ataxia or problems with walking or coordination,  change in mood/affect or memory.         Review of Systems     Objective:   Physical Exam    amb wf nad  Wt Readings from Last 3 Encounters:  07/04/14 268 lb (121.564 kg)  06/19/14 258 lb 9.6 oz (117.3 kg)  05/31/14 264 lb (119.75 kg)      HEENT: nl dentition, turbinates, and orophanx. Nl external ear canals without cough reflex   NECK :  without JVD/Nodes/TM/ nl carotid upstrokes bilaterally   LUNGS: no acc muscle use, clear to A and P bilaterally without cough on insp or exp maneuvers   CV:  RRR  no s3  or murmur or increase in P2, no edema   ABD:  soft and nontender with nl excursion in the supine position. No bruits or organomegaly, bowel sounds nl  MS:  warm without deformities, calf tenderness, cyanosis or clubbing  SKIN: warm and dry without lesions    NEURO:  alert, approp, no deficits     Recent Labs Lab 07/04/14 1650  NA 139  K 4.5  CL 104  CO2 27  BUN 17  CREATININE 0.9   GLUCOSE 97    Recent Labs Lab 07/04/14 1650  HGB 12.6  HCT 37.9  WBC 9.7  PLT 364.0     Lab Results  Component Value Date   TSH 2.79 07/04/2014     Lab Results  Component Value Date   PROBNP 38.0 07/04/2014    Lab Results  Component Value Date   ALT 21 07/04/2014   AST 20 07/04/2014   ALKPHOS 83 07/04/2014   BILITOT 0.4 07/04/2014      Calcium 9.6  07/04/14   Assessment:        Outpatient Encounter Prescriptions as of 07/04/2014  Medication Sig  . Coenzyme Q10 (CO Q 10 PO) Take 1 capsule by mouth daily.  . Rivaroxaban (XARELTO) 15 MG TABS tablet Take 1 tablet (15 mg total) by mouth 2 (two) times daily with a meal.  . sertraline (ZOLOFT) 100 MG tablet Take 50 mg by mouth at bedtime.   . [DISCONTINUED] rosuvastatin (CRESTOR) 10 MG tablet Take 10 mg by mouth daily.  Derrill Memo ON 07/10/2014] rivaroxaban (XARELTO) 20 MG TABS tablet Take 1 tablet (20 mg total) by mouth daily with supper.  . [DISCONTINUED] omeprazole (PRILOSEC) 20 MG capsule Take 20 mg by mouth as needed.  . [DISCONTINUED] oxyCODONE (OXY IR/ROXICODONE) 5 MG immediate release tablet Take 1 tablet (5 mg total) by mouth every 4 (four) hours as needed for moderate pain.

## 2014-07-05 ENCOUNTER — Inpatient Hospital Stay: Payer: BC Managed Care – PPO | Admitting: Internal Medicine

## 2014-07-05 LAB — BASIC METABOLIC PANEL
BUN: 17 mg/dL (ref 6–23)
CO2: 27 mEq/L (ref 19–32)
Calcium: 9.6 mg/dL (ref 8.4–10.5)
Chloride: 104 mEq/L (ref 96–112)
Creatinine, Ser: 0.9 mg/dL (ref 0.4–1.2)
GFR: 68.77 mL/min (ref >60.00)
Glucose, Bld: 97 mg/dL (ref 70–99)
Potassium: 4.5 mEq/L (ref 3.5–5.1)
Sodium: 139 mEq/L (ref 135–145)

## 2014-07-05 LAB — HEPATIC FUNCTION PANEL
ALT: 21 U/L (ref 0–35)
AST: 20 U/L (ref 0–37)
Albumin: 4.3 g/dL (ref 3.5–5.2)
Alkaline Phosphatase: 83 U/L (ref 39–117)
Bilirubin, Direct: 0.1 mg/dL (ref 0.0–0.3)
Total Bilirubin: 0.4 mg/dL (ref 0.2–1.2)
Total Protein: 7.8 g/dL (ref 6.0–8.3)

## 2014-07-05 LAB — TSH: TSH: 2.79 u[IU]/mL (ref 0.35–4.50)

## 2014-07-06 DIAGNOSIS — E785 Hyperlipidemia, unspecified: Secondary | ICD-10-CM | POA: Insufficient documentation

## 2014-07-06 NOTE — Assessment & Plan Note (Signed)
Try off statins 07/04/14 x 2 weeks due to muscle aches

## 2014-07-06 NOTE — Assessment & Plan Note (Signed)
-   CTa pos sub massive PE 06/16/14 with bilateral dvt - 2 d Echo 06/18/14 ok RV  Sheis doing great but  has some myalgias/ ha but these are mild so should continue for now the xarelto and f/u with Dr Joya Gaskins as planned in 1 m

## 2014-07-14 ENCOUNTER — Telehealth: Payer: Self-pay | Admitting: Critical Care Medicine

## 2014-07-14 NOTE — Telephone Encounter (Signed)
Called and spoke with pt and she stated that her insurance company would not accept the letter that MW wrote for her. She stated that she is still out of work and they were supposed to send over papers for MW to fill out.  MW please advise if these have been received.  Thanks  No Known Allergies  . Current Outpatient Prescriptions on File Prior to Visit  Medication Sig Dispense Refill  . Coenzyme Q10 (CO Q 10 PO) Take 1 capsule by mouth daily.      . Rivaroxaban (XARELTO) 15 MG TABS tablet Take 1 tablet (15 mg total) by mouth 2 (two) times daily with a meal.  41 tablet  0  . rivaroxaban (XARELTO) 20 MG TABS tablet Take 1 tablet (20 mg total) by mouth daily with supper.  30 tablet  0  . sertraline (ZOLOFT) 100 MG tablet Take 50 mg by mouth at bedtime.        No current facility-administered medications on file prior to visit.

## 2014-07-17 NOTE — Telephone Encounter (Signed)
LMTCB x 1 

## 2014-07-17 NOTE — Telephone Encounter (Signed)
Pt returning call.Michele Nunez ° °

## 2014-07-17 NOTE — Telephone Encounter (Signed)
Have not received any info on this

## 2014-07-17 NOTE — Telephone Encounter (Signed)
Called spoke with pt. Aware of below. She will call her insurance back to see if they can call us or refax papers. Pt does have correct fax #. Nothing further needed

## 2014-07-20 ENCOUNTER — Telehealth: Payer: Self-pay | Admitting: Critical Care Medicine

## 2014-07-20 NOTE — Telephone Encounter (Signed)
LMTCB  X 1 for [employer]

## 2014-07-21 NOTE — Telephone Encounter (Signed)
Spoke with Claiborne Billings @ [employer]  Needing to know the patients current work status. Patient Instructions     Stop crestor x two weeks to see what difference it makes with your muscle aches  Please remember to go to the lab department downstairs for your tests - we will call you with the results when they are available.  No work until return here in one month to regroup with Dr Joya Gaskins   Pt scheduled to see Dr Joya Gaskins on 08/07/14.   Nothing further needed.

## 2014-08-07 ENCOUNTER — Other Ambulatory Visit (INDEPENDENT_AMBULATORY_CARE_PROVIDER_SITE_OTHER): Payer: BC Managed Care – PPO

## 2014-08-07 ENCOUNTER — Ambulatory Visit (INDEPENDENT_AMBULATORY_CARE_PROVIDER_SITE_OTHER): Payer: BC Managed Care – PPO | Admitting: Critical Care Medicine

## 2014-08-07 ENCOUNTER — Encounter: Payer: Self-pay | Admitting: Critical Care Medicine

## 2014-08-07 ENCOUNTER — Encounter: Payer: Self-pay | Admitting: *Deleted

## 2014-08-07 ENCOUNTER — Ambulatory Visit: Payer: BC Managed Care – PPO | Admitting: *Deleted

## 2014-08-07 VITALS — BP 140/84 | HR 77 | Ht 68.0 in | Wt 273.2 lb

## 2014-08-07 DIAGNOSIS — D6851 Activated protein C resistance: Secondary | ICD-10-CM | POA: Insufficient documentation

## 2014-08-07 DIAGNOSIS — Z23 Encounter for immunization: Secondary | ICD-10-CM

## 2014-08-07 DIAGNOSIS — Z86711 Personal history of pulmonary embolism: Secondary | ICD-10-CM

## 2014-08-07 DIAGNOSIS — I2699 Other pulmonary embolism without acute cor pulmonale: Secondary | ICD-10-CM

## 2014-08-07 DIAGNOSIS — T50905S Adverse effect of unspecified drugs, medicaments and biological substances, sequela: Secondary | ICD-10-CM

## 2014-08-07 LAB — HEPATIC FUNCTION PANEL
ALT: 21 U/L (ref 0–35)
AST: 23 U/L (ref 0–37)
Albumin: 3.7 g/dL (ref 3.5–5.2)
Alkaline Phosphatase: 66 U/L (ref 39–117)
Bilirubin, Direct: 0.1 mg/dL (ref 0.0–0.3)
Total Bilirubin: 0.8 mg/dL (ref 0.2–1.2)
Total Protein: 7.3 g/dL (ref 6.0–8.3)

## 2014-08-07 LAB — CBC
HCT: 40.2 % (ref 36.0–46.0)
Hemoglobin: 13.1 g/dL (ref 12.0–15.0)
MCHC: 32.6 g/dL (ref 30.0–36.0)
MCV: 86.1 fl (ref 78.0–100.0)
Platelets: 341 10*3/uL (ref 150.0–400.0)
RBC: 4.66 Mil/uL (ref 3.87–5.11)
RDW: 14.3 % (ref 11.5–15.5)
WBC: 9.2 10*3/uL (ref 4.0–10.5)

## 2014-08-07 LAB — D-DIMER, QUANTITATIVE (NOT AT ARMC): D-Dimer, Quant: 0.32 ug/mL-FEU (ref 0.00–0.48)

## 2014-08-07 MED ORDER — RIVAROXABAN 20 MG PO TABS: 20.0000 mg | ORAL_TABLET | Freq: Every day | ORAL | Status: DC

## 2014-08-07 NOTE — Assessment & Plan Note (Signed)
History deep venous thrombosis submassive with bilateral deep venous thrombosis in lower extremities. Patient is stable at this time. Heterozygous for factor V Leiden is noted but no other hypercoagulable studies were positive Plan Maintain Xarelto for at least 6 months minimum Check d-dimer liver function profile and CBC at this visit Depending upon d-dimer levels may give consideration to increasing Xarelto duration to 12 months

## 2014-08-07 NOTE — Patient Instructions (Signed)
Labs today Work release for next Monday was given Flu vaccine was given Return 4 months

## 2014-08-07 NOTE — Progress Notes (Signed)
Quick Note:  Call pt and tell her labs are ok, No change in medications ______ 

## 2014-08-07 NOTE — Assessment & Plan Note (Signed)
Factor V Leiden carrier but not a homozygote Plan Maintain Xarelto for minimum 6 months

## 2014-08-07 NOTE — Progress Notes (Signed)
Subjective:    Patient ID: Michele Nunez, female    DOB: [birth year], 58 y.o.   MRN: [id number]  HPI 08/07/2014 Chief Complaint  Patient presents with  . 1 month follow up    Breathing has improved but still feels need to take deep breath at times.  BLE still feel "tight;" R>L.  Some chest tightness and nonprod cough unchanged.    Hx of PE and DVT CT Angio 9/19: RLL RML LLL PE DVT + bilat post tib veins Pt on xarelto 20mg /d .   Heterozygous Factor V All other hypercoaguable studies normal occ catches a deep breath, legs feel tight.  No real chest pain. No cough  Less dyspnea.    Review of Systems Constitutional:   No  weight loss, night sweats,  Fevers, chills, fatigue, lassitude. HEENT:   No headaches,  Difficulty swallowing,  Tooth/dental problems,  Sore throat,                No sneezing, itching, ear ache, nasal congestion, post nasal drip,   CV:  No chest pain,  Orthopnea, PND, swelling in lower extremities, anasarca, dizziness, palpitations  GI  No heartburn, indigestion, abdominal pain, nausea, vomiting, diarrhea, change in bowel habits, loss of appetite  Resp: Notes shortness of breath with exertion not at rest.  No excess mucus, no productive cough,  No non-productive cough,  No coughing up of blood.  No change in color of mucus.  No wheezing.  No chest wall deformity  Skin: no rash or lesions.  GU: no dysuria, change in color of urine, no urgency or frequency.  No flank pain.  MS:  No joint pain or swelling.  No decreased range of motion.  No back pain.  Psych:  No change in mood or affect. No depression or anxiety.  No memory loss.     Objective:   Physical Exam Filed Vitals:   08/07/14 0928  BP: 140/84  Pulse: 77  Height: 5\' 8"  (1.727 m)  Weight: 273 lb 3.2 oz (123.923 kg)  SpO2: 96%    Gen: Pleasant, obese F , in no distress,  normal affect  ENT: No lesions,  mouth clear,  oropharynx clear, no postnasal drip  Neck: No JVD, no TMG, no carotid  bruits  Lungs: No use of accessory muscles, no dullness to percussion, clear without rales or rhonchi  Cardiovascular: RRR, heart sounds normal, no murmur or gallops, no peripheral edema  Abdomen: soft and NT, no HSM,  BS normal  Musculoskeletal: No deformities, no cyanosis or clubbing  Neuro: alert, non focal  Skin: Warm, no lesions or rashes  No results found.      Assessment & Plan:   Pulmonary embolism History deep venous thrombosis submassive with bilateral deep venous thrombosis in lower extremities. Patient is stable at this time. 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 Coenzyme Q10 (CO Q 10 PO) Take 1 capsule by mouth daily.  . rivaroxaban (XARELTO) 20 MG TABS tablet Take 1 tablet (20 mg total) by mouth daily with supper.  Marland Kitchen  rosuvastatin (CRESTOR) 10 MG tablet Take 10 mg by mouth daily.  . sertraline (ZOLOFT) 100 MG tablet Take 50 mg by mouth at bedtime.   . [DISCONTINUED] rivaroxaban (XARELTO) 20 MG TABS tablet Take 1 tablet (20 mg total) by mouth daily with supper.  . [DISCONTINUED] Rivaroxaban (XARELTO) 15 MG TABS tablet Take 1 tablet (15 mg total) by mouth 2 (two) times daily with a meal.

## 2014-08-09 NOTE — Progress Notes (Signed)
Quick Note:  Spoke with pt. Discussed lab results and recs per Dr.Wright. She verbalized understanding and voiced no further questions or concerns at this time. ______ 

## 2014-08-09 NOTE — Progress Notes (Signed)
Quick Note:  lmomtcb for pt on home and cell #s ______ 

## 2014-10-02 ENCOUNTER — Telehealth: Payer: Self-pay | Admitting: Critical Care Medicine

## 2014-10-02 DIAGNOSIS — I2699 Other pulmonary embolism without acute cor pulmonale: Secondary | ICD-10-CM

## 2014-10-02 NOTE — Telephone Encounter (Signed)
Pt c/o center chest pain continues.  Pt states pain hasnt really gone away.  Sob on exertion since starting back to work.  Still on Xarelto 20 mg/day.  Non prod cough.  Please advise.

## 2014-10-02 NOTE — Telephone Encounter (Signed)
Spoke with pt and advised of Dr Bettina Gavia recommendations.  Order placed for CT.  Pt will schedule f/u appt once CT is complete.

## 2014-10-02 NOTE — Telephone Encounter (Signed)
Pt needs CT Angio of Chest : hx of pulmonary embolism, evaluate for interval change on xarelto  Then needs OV after CT Angio is completed.: TP is ok with me to schedule

## 2014-10-06 ENCOUNTER — Ambulatory Visit (INDEPENDENT_AMBULATORY_CARE_PROVIDER_SITE_OTHER)
Admission: RE | Admit: 2014-10-06 | Discharge: 2014-10-06 | Disposition: A | Discharge status: Home / Self Care | Payer: BLUE CROSS/BLUE SHIELD | Source: Ambulatory Visit | Attending: Critical Care Medicine | Admitting: Critical Care Medicine

## 2014-10-06 ENCOUNTER — Telehealth: Payer: Self-pay | Admitting: Critical Care Medicine

## 2014-10-06 DIAGNOSIS — I2699 Other pulmonary embolism without acute cor pulmonale: Secondary | ICD-10-CM

## 2014-10-06 MED ORDER — IOHEXOL 350 MG/ML SOLN
80.0000 mL | Freq: Once | INTRAVENOUS | Status: AC | PRN
  Administered 2014-10-06: 80 mL via INTRAVENOUS

## 2014-10-06 MED ORDER — PREDNISONE 10 MG PO TABS: ORAL_TABLET | ORAL | Status: DC

## 2014-10-06 MED ORDER — ALBUTEROL SULFATE HFA 108 (90 BASE) MCG/ACT IN AERS: 2.0000 | INHALATION_SPRAY | Freq: Four times a day (QID) | RESPIRATORY_TRACT | Status: DC | PRN

## 2014-10-06 NOTE — Telephone Encounter (Signed)
I called pt and gave her CT results  i called in steroids and inhaler  pls get pt f/u appt in Feb

## 2014-10-06 NOTE — Telephone Encounter (Signed)
Called, spoke with pt.  Pt requesting to call back when she gets home to schedule OV.  Will await call.

## 2014-10-10 NOTE — Telephone Encounter (Signed)
Pt has scheduled appt with PW on Nov 22, 2014.  Will sign off.

## 2014-10-11 ENCOUNTER — Telehealth: Payer: Self-pay | Admitting: Critical Care Medicine

## 2014-10-11 NOTE — Telephone Encounter (Signed)
ATC x 2 busy signal. wcb

## 2014-10-16 NOTE — Telephone Encounter (Signed)
LMTCB

## 2014-10-16 NOTE — Telephone Encounter (Signed)
Called pt and appt needed to be r/s'd. Nothing further needed

## 2014-10-16 NOTE — Telephone Encounter (Signed)
Pt returned call - [phone]

## 2014-10-16 NOTE — Telephone Encounter (Signed)
lmtcb x1 

## 2014-10-16 NOTE — Telephone Encounter (Signed)
Return call.Michele Nunez °

## 2014-11-08 ENCOUNTER — Ambulatory Visit (INDEPENDENT_AMBULATORY_CARE_PROVIDER_SITE_OTHER): Payer: BLUE CROSS/BLUE SHIELD | Admitting: Critical Care Medicine

## 2014-11-08 ENCOUNTER — Encounter: Payer: Self-pay | Admitting: Critical Care Medicine

## 2014-11-08 VITALS — BP 154/78 | HR 76 | Temp 98.5°F | Ht 68.0 in | Wt 271.0 lb

## 2014-11-08 DIAGNOSIS — I2699 Other pulmonary embolism without acute cor pulmonale: Secondary | ICD-10-CM

## 2014-11-08 NOTE — Progress Notes (Signed)
Subjective:    Patient ID: Michele Nunez, female    DOB: 1955-11-21, 59 y.o.   MRN: [id number]  HPI 09/08/2015 Chief Complaint  Patient presents with  . 3 month follow up    DOE and cough unchanged.  Cough nonprod.  Chest soreness often.    Overall this patient is doing well with no specific complaints. She has a dry cough only. There is no chest pain. There is less dyspnea. Pt denies any significant sore throat, nasal congestion or excess secretions, fever, chills, sweats, unintended weight loss, pleurtic or exertional chest pain, orthopnea PND, or leg swelling Pt denies any increase in rescue therapy over baseline, denies waking up needing it or having any early am or nocturnal exacerbations of coughing/wheezing/or dyspnea. Pt also denies any obvious fluctuation in symptoms with  weather or environmental change or other alleviating or aggravating factors     Review of Systems Constitutional:   No  weight loss, night sweats,  Fevers, chills, fatigue, lassitude. HEENT:   No headaches,  Difficulty swallowing,  Tooth/dental problems,  Sore throat,                No sneezing, itching, ear ache, nasal congestion, post nasal drip,   CV:  No chest pain,  Orthopnea, PND, swelling in lower extremities, anasarca, dizziness, palpitations  GI  No heartburn, indigestion, abdominal pain, nausea, vomiting, diarrhea, change in bowel habits, loss of appetite  Resp: No shortness of breath with exertion not at rest.  No excess mucus, no productive cough,  No non-productive cough,  No coughing up of blood.  No change in color of mucus.  No wheezing.  No chest wall deformity  Skin: no rash or lesions.  GU: no dysuria, change in color of urine, no urgency or frequency.  No flank pain.  MS:  No joint pain or swelling.  No decreased range of motion.  No back pain.  Psych:  No change in mood or affect. No depression or anxiety.  No memory loss.     Objective:   Physical Exam Filed Vitals:   11/08/14 0941  BP: 154/78  Pulse: 76  Temp: 98.5 F (36.9 C)  TempSrc: Oral  Height: 5\' 8"  (1.727 m)  Weight: 271 lb (122.925 kg)  SpO2: 97%    Gen: Pleasant, obese F , in no distress,  normal affect  ENT: No lesions,  mouth clear,  oropharynx clear, no postnasal drip  Neck: No JVD, no TMG, no carotid bruits  Lungs: No use of accessory muscles, no dullness to percussion, clear without rales or rhonchi  Cardiovascular: RRR, heart sounds normal, no murmur or gallops, no peripheral edema  Abdomen: soft and NT, no HSM,  BS normal  Musculoskeletal: No deformities, no cyanosis or clubbing  Neuro: alert, non focal  Skin: Warm, no lesions or rashes  No results found.      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 and associated factor V Leiden heterozygote Note patient only had compression devices for neck surgery preop in September 2015 Patient is now completing 6 months of anticoagulant therapy Presumption is this was a provoked venous thrombotic event note also deep venous thrombosis was seen Note recent CT scan of chest January 2016 showed complete resolution of all pulmonary emboli and d-dimer November 2015 was normal also note right ventricle was normal in echocardiogram September 2015 Plan Complete 6 months of Xarelto therapy in March Patient is advised if  further surgery is indicated in the future that a heparin injection should be considered the day before the surgical event and then 12 hours after the surgical event depending upon patient's bleeding risk prophylactic therapy should be continued with either Lovenox or heparin A d-dimer will be checked one month after cessation of anticoagulant therapy in April 2016     Updated Medication List Outpatient Encounter Prescriptions as of 11/08/2014  Medication Sig  . albuterol (PROAIR HFA) 108 (90 BASE) MCG/ACT inhaler Inhale 2 puffs into the lungs every  6 (six) hours as needed for wheezing or shortness of breath.  . Coenzyme Q10 (CO Q 10 PO) Take 1 capsule by mouth daily.  Marland Kitchen OVER THE COUNTER MEDICATION Omega red once daily  . rivaroxaban (XARELTO) 20 MG TABS tablet Take 1 tablet (20 mg total) by mouth daily with supper.  . rosuvastatin (CRESTOR) 10 MG tablet Take 10 mg by mouth daily.  . vitamin E 400 UNIT capsule Take 400 Units by mouth daily.  . [DISCONTINUED] predniSONE (DELTASONE) 10 MG tablet Take 4 for two days three for two days two for two days one for two days (Patient not taking: Reported on 11/08/2014)  . [DISCONTINUED] sertraline (ZOLOFT) 100 MG tablet Take 50 mg by mouth at bedtime.

## 2014-11-08 NOTE — Assessment & Plan Note (Signed)
History of pulmonary embolism with associated deep venous thrombosis and associated factor V Leiden heterozygote Note patient only had compression devices for neck surgery preop in September 2015 Patient is now completing 6 months of anticoagulant therapy Presumption is this was a provoked venous thrombotic event note also deep venous thrombosis was seen Note recent CT scan of chest January 2016 showed complete resolution of all pulmonary emboli and d-dimer November 2015 was normal also note right ventricle was normal in echocardiogram September 2015 Plan Complete 6 months of Xarelto therapy in March Patient is advised if further surgery is indicated in the future that a heparin injection should be considered the day before the surgical event and then 12 hours after the surgical event depending upon patient's bleeding risk prophylactic therapy should be continued with either Lovenox or heparin A d-dimer will be checked one month after cessation of anticoagulant therapy in April 2016

## 2014-11-08 NOTE — Patient Instructions (Signed)
Finish your current xarelto bottle then stop in march Return April for follow up and labs D Dimer If you have surgery in future , you should get a heparin 5000 unit injection the day before surgery, and then 12 hours after your surgery

## 2014-11-22 ENCOUNTER — Ambulatory Visit: Payer: BLUE CROSS/BLUE SHIELD | Admitting: Critical Care Medicine

## 2015-03-01 ENCOUNTER — Other Ambulatory Visit: Payer: Self-pay | Admitting: Critical Care Medicine

## 2015-06-20 IMAGING — CT CT ANGIO CHEST
2 of 9 series · 18 of 46 positions shown · IV contrast (Omni 300)
Comparison: PA and lateral chest x-ray of today's date.

CLINICAL DATA: Two and one half weeks postoperative from
parathyroidectomynow with chest pain and shortness of breath and
positive history of DVT

EXAM:
CT ANGIOGRAPHY CHEST WITH CONTRAST
TECHNIQUE: Multidetector CT imaging of the chest was performed using the
standard protocol during bolus administration of intravenous
contrast. Multiplanar CT image reconstructions and MIPs were
obtained to evaluate the vascular anatomy.
CONTRAST:  100mL OMNIPAQUE IOHEXOL 350 MG/ML SOLN

[Series 5: thins · axial · 0.80mm/px · z∈[+1199,+1457]mm · 15 of 292 slices shown]
[im 17/292  lung]
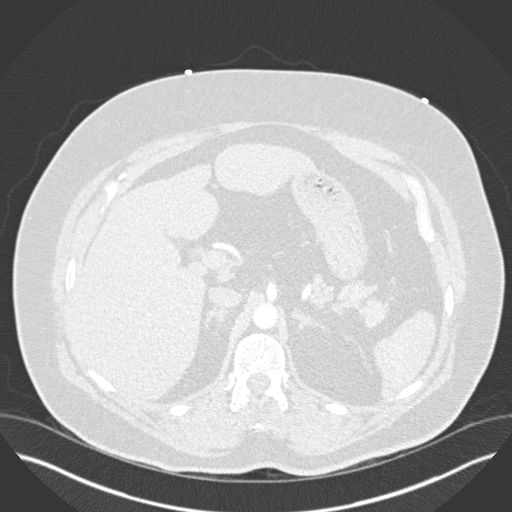
[im 33/292  soft-tissue]
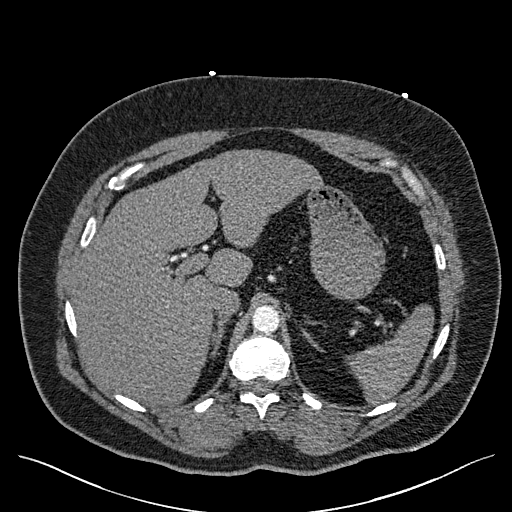
[im 49/292  lung]
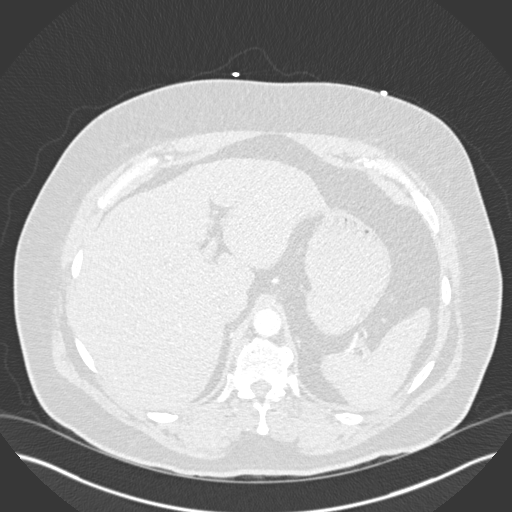
[im 65/292  soft-tissue]
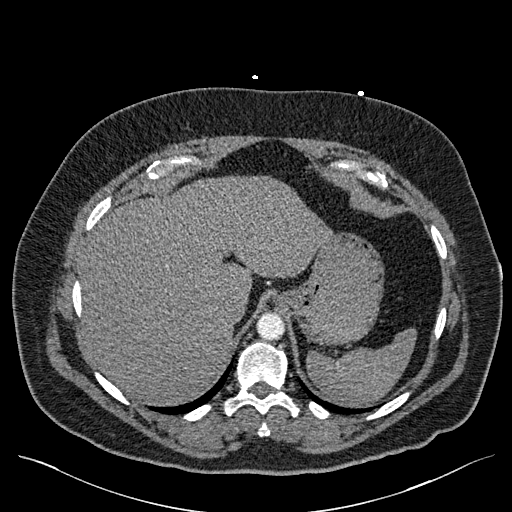
[im 98/292  lung]
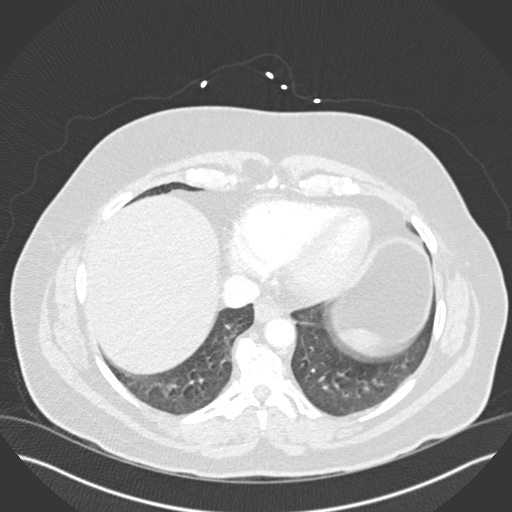
[im 114/292  soft-tissue]
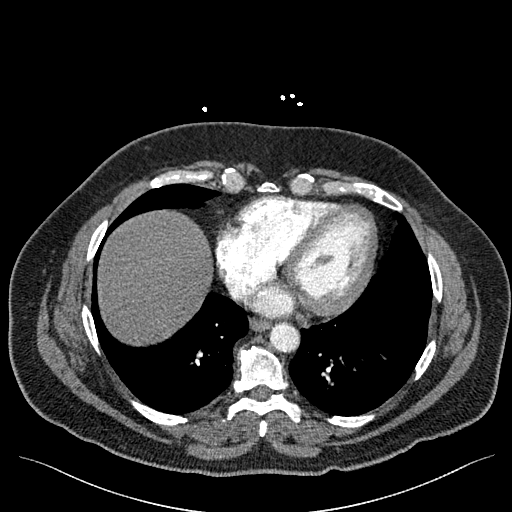
[im 130/292  lung]
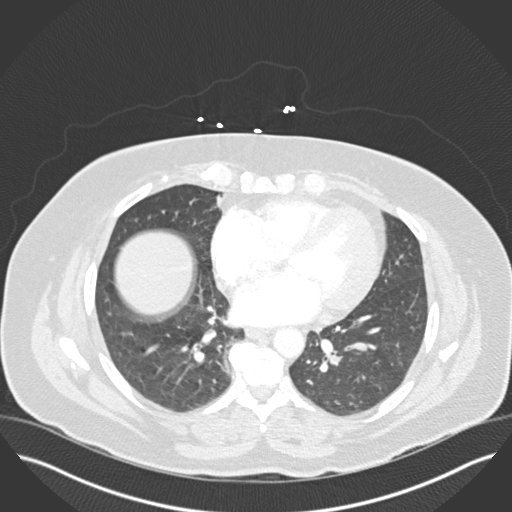
[im 146/292  soft-tissue]
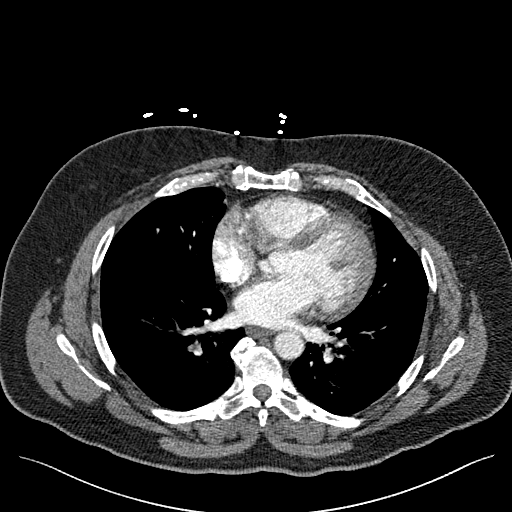
[im 162/292  lung]
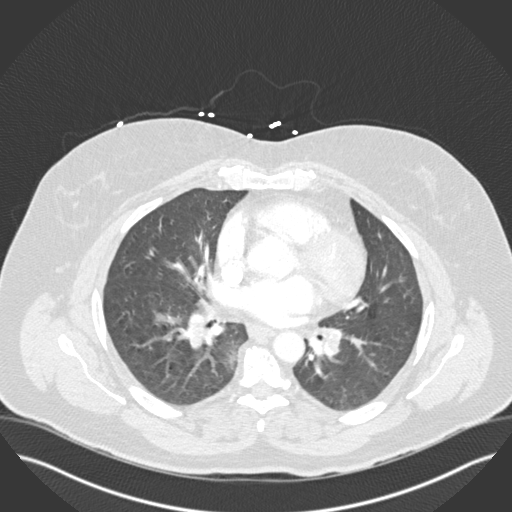
[im 178/292  soft-tissue]
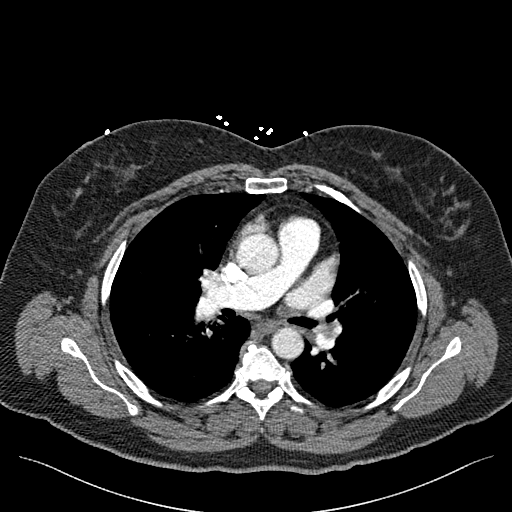
[im 195/292  lung]
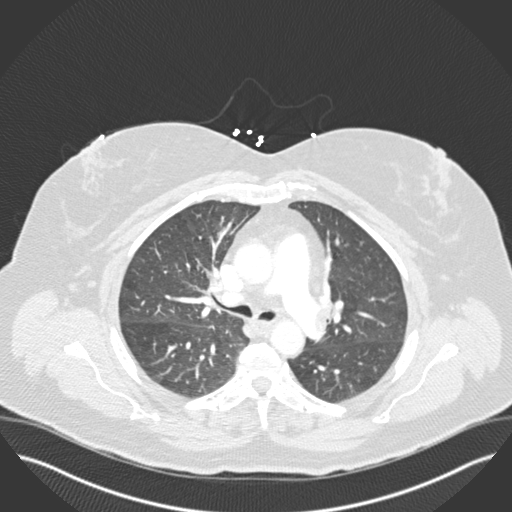
[im 227/292  soft-tissue]
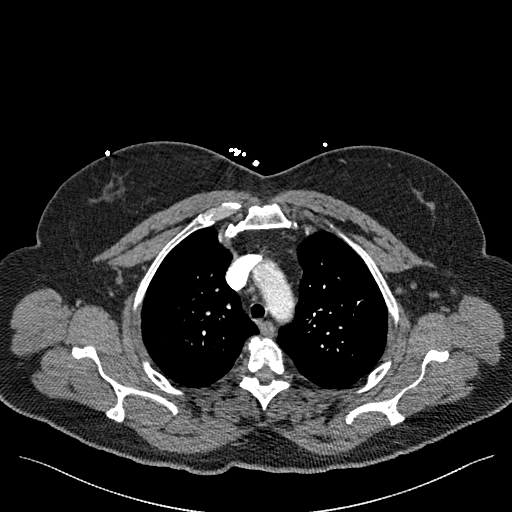
[im 243/292  lung]
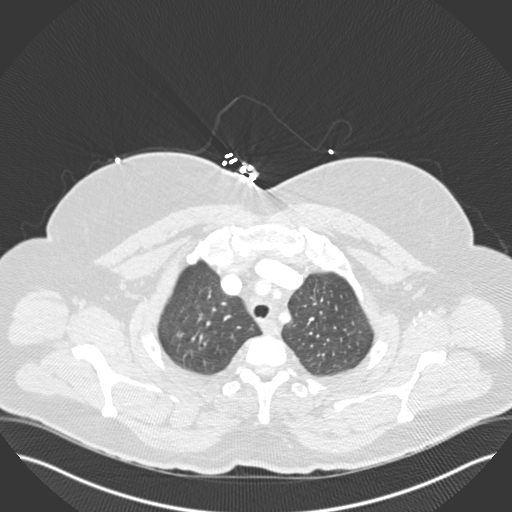
[im 259/292  soft-tissue]
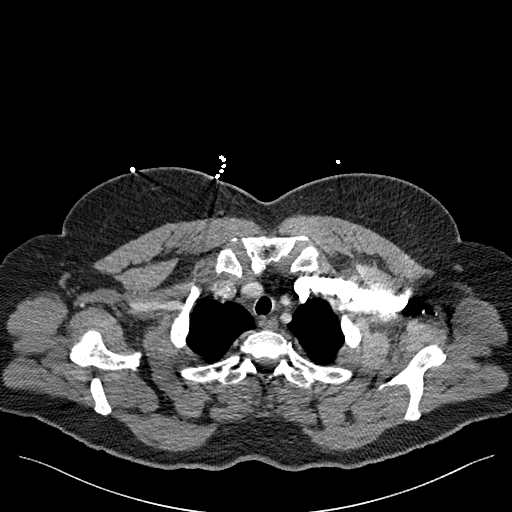
[im 275/292  lung]
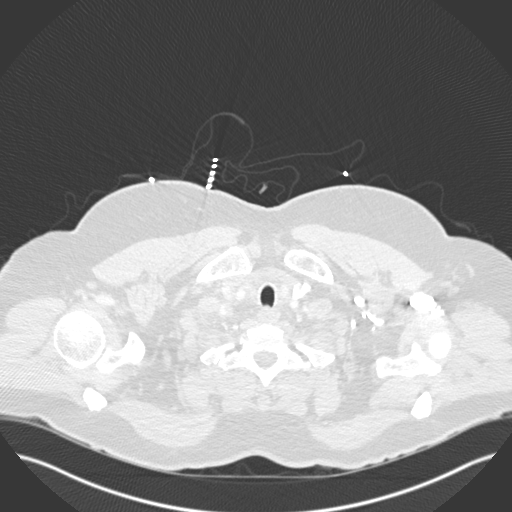

[Series 7: coronal mpr · coronal · 0.59mm/px · 3 of 133 slices shown]
[im 34/133  soft-tissue]
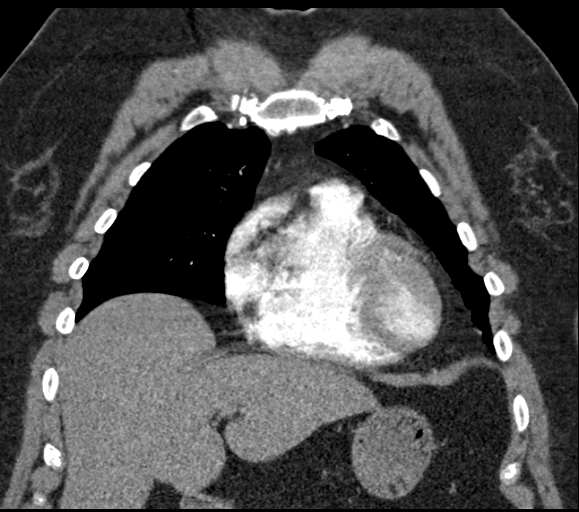
[im 67/133  soft-tissue]
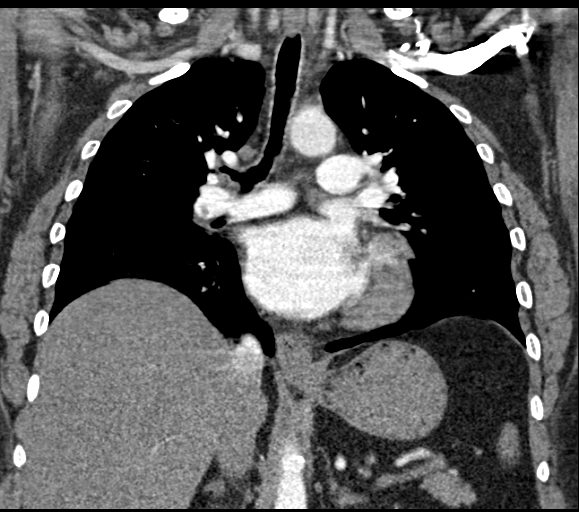
[im 100/133  soft-tissue]
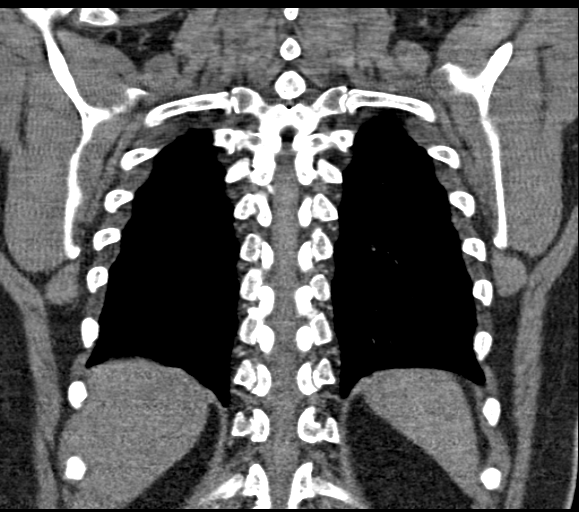

[18 of 46 positions shown; findings below may reference images not displayed]

FINDINGS: There are filling defects within the right middle and lower lobe
pulmonary arteries. There also filling defects within branches of
the left lower lobe pulmonary arteries. The RV -LV ratio is abnormal
at 1.5. The overall cardiac size is not increased. There is no
pericardial effusion. The thoracic aorta exhibits normal caliber and
no evidence of a false lumen.

There is no pleural effusion. There no evidence of a pulmonary
infarction or other acute pulmonary parenchymal abnormality. There
is no evidence of significant hematoma in the region of the thyroid
bed.

The bony thorax is unremarkable. There is calcification of the
anterior longitudinal ligament of the thoracic spine. Within the
upper abdomen no acute abnormalities are demonstrated.

Review of the MIP images confirms the above findings.
IMPRESSION: 1. The patient has bilateral pulmonary involving branches of the
right middle and lower lobe and left lower lobe pulmonary arteries.
Positive for acute PE with CT evidence of right heartstrain (RV/LV
Ratio = 1.5) consistent with at least submassive (intermediate
risk)PE. The presence of right heart strain has been associated with
anincreased risk of morbidity and mortality. Consultation with
Pulmonaryand [REDACTED] is recommended.
2. No other acute intra thoracic abnormality is demonstrated.
3. Critical Value/emergent results were called by telephone at the
time of interpretation on 06/16/2014 at [DATE] to Dr. JAYLON AUJLA
, who verbally acknowledged these results.

## 2017-12-09 ENCOUNTER — Telehealth: Payer: Self-pay | Admitting: Adult Health

## 2017-12-09 NOTE — Telephone Encounter (Signed)
Spoke with [place]. Advised her that since the patient has not been seen in our office for almost 3 years, she will need to contact medical records for the OV notes.   Gave her the number to MR. Nothing else needed at time of call.

## 2020-04-05 ENCOUNTER — Encounter: Payer: Self-pay | Admitting: Gastroenterology

## 2020-06-01 ENCOUNTER — Ambulatory Visit: Payer: BLUE CROSS/BLUE SHIELD | Admitting: Gastroenterology

## 2020-07-06 ENCOUNTER — Encounter: Payer: Self-pay | Admitting: Gastroenterology

## 2020-07-06 ENCOUNTER — Other Ambulatory Visit (INDEPENDENT_AMBULATORY_CARE_PROVIDER_SITE_OTHER): Payer: BLUE CROSS/BLUE SHIELD

## 2020-07-06 ENCOUNTER — Ambulatory Visit: Payer: BLUE CROSS/BLUE SHIELD | Admitting: Gastroenterology

## 2020-07-06 VITALS — BP 128/70 | HR 80 | Ht 67.0 in | Wt 276.1 lb

## 2020-07-06 DIAGNOSIS — Z86718 Personal history of other venous thrombosis and embolism: Secondary | ICD-10-CM | POA: Diagnosis not present

## 2020-07-06 DIAGNOSIS — K219 Gastro-esophageal reflux disease without esophagitis: Secondary | ICD-10-CM

## 2020-07-06 DIAGNOSIS — R1310 Dysphagia, unspecified: Secondary | ICD-10-CM

## 2020-07-06 DIAGNOSIS — R1319 Other dysphagia: Secondary | ICD-10-CM | POA: Diagnosis not present

## 2020-07-06 DIAGNOSIS — D509 Iron deficiency anemia, unspecified: Secondary | ICD-10-CM | POA: Diagnosis not present

## 2020-07-06 DIAGNOSIS — Z86010 Personal history of colonic polyps: Secondary | ICD-10-CM

## 2020-07-06 DIAGNOSIS — Z7901 Long term (current) use of anticoagulants: Secondary | ICD-10-CM

## 2020-07-06 DIAGNOSIS — E611 Iron deficiency: Secondary | ICD-10-CM

## 2020-07-06 LAB — BASIC METABOLIC PANEL
BUN: 21 mg/dL (ref 6–23)
CO2: 31 mEq/L (ref 19–32)
Calcium: 10.2 mg/dL (ref 8.4–10.5)
Chloride: 101 mEq/L (ref 96–112)
Creatinine, Ser: 1.01 mg/dL (ref 0.40–1.20)
GFR: 57.96 mL/min — ABNORMAL LOW (ref >60.00)
Glucose, Bld: 93 mg/dL (ref 70–99)
Potassium: 3.9 mEq/L (ref 3.5–5.1)
Sodium: 140 mEq/L (ref 135–145)

## 2020-07-06 LAB — CBC
HCT: 36.7 % (ref 36.0–46.0)
Hemoglobin: 12.1 g/dL (ref 12.0–15.0)
MCHC: 33.1 g/dL (ref 30.0–36.0)
MCV: 88.5 fl (ref 78.0–100.0)
Platelets: 336 10*3/uL (ref 150.0–400.0)
RBC: 4.15 Mil/uL (ref 3.87–5.11)
RDW: 12.8 % (ref 11.5–15.5)
WBC: 8.3 10*3/uL (ref 4.0–10.5)

## 2020-07-06 MED ORDER — SUPREP BOWEL PREP KIT 17.5-3.13-1.6 GM/177ML PO SOLN: 1.0000 | ORAL | 0 refills | Status: DC

## 2020-07-06 NOTE — Patient Instructions (Signed)
If you are age 66 or older, your body mass index should be between 23-30. Your Body mass index is 43.25 kg/m. If this is out of the aforementioned range listed, please consider follow up with your Primary Care Provider.  If you are age 73 or younger, your body mass index should be between 19-25. Your Body mass index is 43.25 kg/m. If this is out of the aformentioned range listed, please consider follow up with your Primary Care Provider.   Your provider has requested that you go to the basement level for lab work before leaving today. Press "B" on the elevator. The lab is located at the first door on the left as you exit the elevator.  We have sent the following medications to your pharmacy for you to pick up at your convenience: [place] will be contaced by our office prior to your procedure for directions on holding your Xarelto.  If you do not hear from our office 1 week prior to your scheduled procedure, please call [phone] to discuss.  Due to recent changes in healthcare laws, you may see the results of your imaging and laboratory studies on MyChart before your provider has had a chance to review them.  We understand that in some cases there may be results that are confusing or concerning to you. Not all laboratory results come back in the same time frame and the provider may be waiting for multiple results in order to interpret others.  Please give [place] 48 hours in order for your provider to thoroughly review all the results before contacting the office for clarification of your results.   Thank you for choosing me and [facility].  Dr. Rush Landmark

## 2020-07-09 ENCOUNTER — Encounter: Payer: Self-pay | Admitting: Gastroenterology

## 2020-07-09 DIAGNOSIS — K219 Gastro-esophageal reflux disease without esophagitis: Secondary | ICD-10-CM | POA: Insufficient documentation

## 2020-07-09 DIAGNOSIS — Z7901 Long term (current) use of anticoagulants: Secondary | ICD-10-CM | POA: Insufficient documentation

## 2020-07-09 DIAGNOSIS — R1319 Other dysphagia: Secondary | ICD-10-CM | POA: Insufficient documentation

## 2020-07-09 DIAGNOSIS — Z86010 Personal history of colonic polyps: Secondary | ICD-10-CM | POA: Insufficient documentation

## 2020-07-09 DIAGNOSIS — E611 Iron deficiency: Secondary | ICD-10-CM | POA: Insufficient documentation

## 2020-07-09 DIAGNOSIS — Z86718 Personal history of other venous thrombosis and embolism: Secondary | ICD-10-CM | POA: Insufficient documentation

## 2020-07-09 NOTE — Progress Notes (Signed)
[facility] VISIT   Primary Care Provider Normand Sloop, MD [address] [place] [address] [phone]  Referring Provider Lawernce Pitts, [place] [address] [phone]  Patient Profile: Michele Nunez is a 64 y.o. 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  The patient presents to the [facility] for an evaluation and management of problem(s) noted below:  Problem List 1. Esophageal dysphagia   2. Gastroesophageal reflux disease, unspecified whether esophagitis present   3. Iron deficiency   4. History of colonic polyps   5. History of blood clots   6. Chronic anticoagulation     History of Present Illness This is the patient's first visit to the outpatient [facility].  The patient has follow-up with Dr. Earley Brooke and Dr. Posey Pronto in [place] over the course of many years.  She has undergone periodic esophageal dilations in the setting of recurrent dysphagia and longstanding GERD.  Patient was recently evaluated by her ENT group in [place].  She had previously been taking her Prilosec on a daily basis for years and then stopped it and then started having symptoms again and after being evaluated in June of this year was restarted on omeprazole 40 mg daily.  The patient describes longstanding heartburn symptoms.  She has had 3-4 esophageal dilations in the past with the last one being more than 6 to 8 years ago per her report.  She has had improvement with her esophageal dilations but does not have the actual records.  She has nausea on an infrequent basis.  Abdominal pain occurs at times but it comes and goes.  There is no prandial component to this.  Has had colon polyps per her report as well.   The patient has had issues of recurrent blood clots/pulmonary emboli in the setting of postprocedural complications from other surgeries.  She describes some surgeries for which she was put back on her blood thinner that evening.  She never underwent an esophageal dilation of the colonoscopy on blood thinners however her symptoms that after.  She takes Xarelto on a daily basis as a result.  She recalls being given shots after some of her procedures.  Patient has never undergone esophageal manometry testing per her report and has only undergone barium swallows other than the work-up of this.  The patient does take nonsteroidals on an infrequent basis.  Last colonoscopy was greater than 5 years ago per her report.  You do not have access to these.  She has irregular bowel movements on a daily basis.  No blood in her stools.  GI Review of Systems Positive as above Negative for odynophagia, decreased appetite, change in bowel habits, melena, hematochezia  Review of Systems General: Denies fevers/chills/weight loss unintentionally HEENT: Denies oral lesions Cardiovascular: Denies chest pain Pulmonary: Denies shortness of breath Gastroenterological: See HPI Genitourinary: Denies darkened urine Hematological: Positive for history of easy bruising/bleeding due to anticoagulation Dermatological: Denies jaundice Psychological: Mood is stable   Medications Current Outpatient Medications  Medication Sig Dispense Refill  . FERROCITE 324 MG TABS tablet Take 1 tablet by mouth daily.    . hydrochlorothiazide (HYDRODIURIL) 25 MG tablet Take 25 mg by mouth daily.    . irbesartan (AVAPRO) 150 MG tablet Take 150 mg by mouth daily.    . Multiple Vitamin (MULTIVITAMIN) tablet Take 1  tablet by mouth daily.    Marland Kitchen omeprazole (PRILOSEC) 40 MG capsule Take 40 mg by mouth every morning.    . rosuvastatin (CRESTOR) 10 MG tablet Take 10 mg by mouth daily.    Alveda Reasons 20 MG TABS tablet TAKE 1 TABLET BY MOUTH EVERY  EVENING WITH SUPPER 30 tablet 0  . Na Sulfate-K Sulfate-Mg Sulf (SUPREP BOWEL PREP KIT) 17.5-3.13-1.6 GM/177ML SOLN Take 1 kit by mouth as directed. For colonoscopy prep 354 mL 0   No current facility-administered medications for this visit.    Allergies No Known Allergies  Histories Past Medical History:  Diagnosis Date  . Anxiety    "panic attacks"  . Arthritis    "probably"  . Complication of anesthesia    "could not breath and heard the doctor saying she was turning blue-woke up with bruises on chest and very sore for kidney stone surgery"-anesthesia record on chart  . Gallstones   . GERD (gastroesophageal reflux disease)   . History of kidney stones   . Hypercholesteremia    "mild"  . Hyperparathyroidism ([place])   . Hyperthyroidism   . Parathyroid adenoma   . Pulmonary embolism ([place])   . Shortness of breath    with activity  . Status post dilation of esophageal narrowing    Past Surgical History:  Procedure Laterality Date  . ANKLE SURGERY Left   . CARDIAC CATHETERIZATION  20 years ago   clear  . CHOLECYSTECTOMY  09/2013  . COLONOSCOPY    . FINGER SURGERY Right    ring finger-pin  . KIDNEY STONE SURGERY  08/2013  . MINIMALLY INVASIVE RADIOACTIVE PARATHYROIDECTOMY N/A 05/31/2014   Procedure: LEFT INFERIOR PARATHYROIDECTOMY ;  Surgeon: Fanny Skates, MD;  Location: [facility];  Service: General;  Laterality: N/A;  . TIBIA FRACTURE SURGERY Right   . TONSILLECTOMY  in 20's  . TOTAL KNEE ARTHROPLASTY Left   . WRIST FRACTURE SURGERY Right 1995   Social History   Socioeconomic History  . Marital status: Married    Spouse name: Not on file  . Number of children: 1  . Years of education: Not on file  . Highest education level: Not on file  Occupational History  . Occupation: Designer, fashion/clothing  Tobacco Use  . Smoking status: Former Smoker    Packs/day: 1.00    Years: 20.00    Pack years: 20.00    Types: Cigarettes    Quit date: 09/29/1988    Years since quitting: 31.7  .  Smokeless tobacco: Never Used  Vaping Use  . Vaping Use: Never used  Substance and Sexual Activity  . Alcohol use: No  . Drug use: No  . Sexual activity: Not on file  Other Topics Concern  . Not on file  Social History Narrative  . Not on file   Social Determinants of Health   Financial Resource Strain:   . Difficulty of Paying Living Expenses: Not on file  Food Insecurity:   . Worried About Charity fundraiser in the Last Year: Not on file  . Ran Out of Food in the Last Year: Not on file  Transportation Needs:   . Lack of Transportation (Medical): Not on file  . Lack of Transportation (Non-Medical): Not on file  Physical Activity:   . Days of Exercise per Week: Not on file  . Minutes of Exercise per Session: Not on file  Stress:   . Feeling of Stress : Not on file  Social Connections:   . Frequency  of Communication with Friends and Family: Not on file  . Frequency of Social Gatherings with Friends and Family: Not on file  . Attends Religious Services: Not on file  . Active Member of Clubs or Organizations: Not on file  . Attends Archivist Meetings: Not on file  . Marital Status: Not on file  Intimate Partner Violence:   . Fear of Current or Ex-Partner: Not on file  . Emotionally Abused: Not on file  . Physically Abused: Not on file  . Sexually Abused: Not on file   Family History  Problem Relation Age of Onset  . Diabetes Mother   . Heart disease Father   . Ovarian cancer Sister   . Diabetes Maternal Grandmother   . Lymphoma Son   . Colon cancer Neg Hx   . Esophageal cancer Neg Hx   . Inflammatory bowel disease Neg Hx   . Liver disease Neg Hx   . Pancreatic cancer Neg Hx   . Rectal cancer Neg Hx   . Stomach cancer Neg Hx    I have reviewed her medical, social, and family history in detail and updated the electronic medical record as necessary.    PHYSICAL EXAMINATION  BP 128/70 (BP Location: Left Arm, Patient Position: Sitting, Cuff Size: Normal)    Pulse 80   Ht 5' 7"  (1.702 m) Comment: height measured without shoes  Wt 276 lb 2 oz (125.2 kg)   BMI 43.25 kg/m  Wt Readings from Last 3 Encounters:  07/06/20 276 lb 2 oz (125.2 kg)  11/08/14 271 lb (122.9 kg)  08/07/14 273 lb 3.2 oz (123.9 kg)  GEN: NAD, appears stated age, doesn't appear chronically ill PSYCH: Cooperative, without pressured speech EYE: Conjunctivae pink, sclerae anicteric ENT: MMM CV: Nontachycardic RESP: No audible wheezing GI: NABS, soft, protuberant abdomen, rounded, nontender, without rebound or guarding MSK/EXT: No lower extremity edema SKIN: No jaundice NEURO:  Alert & Oriented x 3, no focal deficits   REVIEW OF DATA  I reviewed the following data at the time of this encounter:  GI Procedures and Studies  Reported previous endoscopies and colonoscopies in [place] do not have access to those records but would like to get them  Laboratory Studies  Reviewed those in epic though these are 64 years old need to get outside records if possible  Imaging Studies  No relevant studies to review   ASSESSMENT  Ms. Bachicha is a 64 y.o. 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  The patient is seen today for evaluation and management of:  1. Esophageal dysphagia   2. Gastroesophageal reflux disease, unspecified whether esophagitis present   3. Iron deficiency   4. History of colonic polyps   5. History of blood clots   6. Chronic anticoagulation    The patient is hemodynamically stable.  Clinically however, she has had recurrence of her dysphagia symptoms.  She has longstanding GERD.  Concern for esophagitis or peptic stricturing.  However, with the amount of esophageal dilations that she has undergone, must also consider the likelihood of an  underlying dysmotility as a potential etiology for her recurrent symptoms.  Esophageal manometry testing was discussed with the patient as well as the potential work-up method for her symptoms.  Esophageal evaluation and biopsies need to be performed and will move we will proceed with a diagnostic endoscopy with likely dilation.  She is also due for colon polyp surveillance per her history of previous colonoscopies and we will move forward with getting that scheduled as a combined procedure.  The biggest issue will be her anticoagulation.  She describes history of recurrent blood clots occurring after procedures although never after a colonoscopy or endoscopy but she has never undergone these procedures when she has been anticoagulated.  I think given her kidney function is normal and that holding of Xarelto for 1 day prior to her procedure it is reasonable regards to risk of procedure.  Restart of her Xarelto will be based on the findings of her upper and lower endoscopy.  I am okay if the patient is felt to need bridging and we will reach out to the patient's primary care doctor who will help [place] determine what they feel is the most efficient means of anticoagulation for this individual.  Again, I am okay for bridging if they feel it is necessary.  Certainly, if an empiric dilation was performed and no polyps are removed we may restart her Xarelto within 24 hours, but if multiple polyps were found or removed we will not likely be able to restart NOAC that quickly.  She understands this risk of potential recurrent blood clots would like to try and minimize this is much as possible and so I recommended TED hose to be worn on the day of her procedures as well.  The risks and benefits of endoscopic evaluation were discussed with the patient; these include but are not limited to the risk of perforation, infection, bleeding, missed lesions, lack of diagnosis, severe illness requiring hospitalization, as well as  anesthesia and sedation related illnesses.  The patient is agreeable to proceed.  All patient questions were answered to the best of my ability, and the patient agrees to the aforementioned plan of action with follow-up as indicated.   PLAN  Laboratories as outlined below Proceed with scheduling EGD (esophageal biopsies at minimum and empiric dilation) With scheduling colonoscopy Proceed with obtaining approval for anticoagulation plan around procedure -Okay from a GI standpoint if kidney function is normal for Xarelto hold for 24 hours prior to procedure -Okay from a GI standpoint if PCP feels that Lovenox bridging is necessary for that to be done   Orders Placed This Encounter  Procedures  . CBC  . Basic Metabolic Panel (BMET)  . Ambulatory referral to Gastroenterology    New Prescriptions   NA SULFATE-K SULFATE-MG SULF (SUPREP BOWEL PREP KIT) 17.5-3.13-1.6 GM/177ML SOLN    Take 1 kit by mouth as directed. For colonoscopy prep   Modified Medications   No medications on file    Planned Follow Up No follow-ups on file.   Total Time in Face-to-Face and in Coordination of Care for patient including independent/personal interpretation/review of prior testing, medical history, examination, medication adjustment, communicating results with the patient directly, and documentation with the EHR is greater than 45 minutes.   Justice Britain, MD [facility] Office # [phone]

## 2020-07-18 ENCOUNTER — Telehealth: Payer: Self-pay

## 2020-07-18 NOTE — Telephone Encounter (Signed)
Called and left message asking for return call.

## 2020-07-18 NOTE — Telephone Encounter (Signed)
Please make sure she starts using TED hose/Support hose. We will get her back on anticoagulation as quickly as we can based on what we find. Thanks. GM

## 2020-07-18 NOTE — Telephone Encounter (Signed)
Per Dr.McGee okay for pt to hold Xarelto x2 days prior to procedure.Pt has been informed to hold Xarelto starting today. No Lovenox Bridge needed. Pt voiced understanding.

## 2020-07-20 ENCOUNTER — Encounter: Payer: Self-pay | Admitting: Gastroenterology

## 2020-07-20 ENCOUNTER — Ambulatory Visit (AMBULATORY_SURGERY_CENTER): Payer: Medicare Other | Admitting: Gastroenterology

## 2020-07-20 ENCOUNTER — Other Ambulatory Visit: Payer: Self-pay

## 2020-07-20 VITALS — BP 110/62 | HR 84 | Temp 97.5°F | Resp 24 | Ht 67.0 in | Wt 276.0 lb

## 2020-07-20 DIAGNOSIS — D124 Benign neoplasm of descending colon: Secondary | ICD-10-CM

## 2020-07-20 DIAGNOSIS — D123 Benign neoplasm of transverse colon: Secondary | ICD-10-CM

## 2020-07-20 DIAGNOSIS — D120 Benign neoplasm of cecum: Secondary | ICD-10-CM

## 2020-07-20 DIAGNOSIS — K5520 Angiodysplasia of colon without hemorrhage: Secondary | ICD-10-CM

## 2020-07-20 DIAGNOSIS — K2970 Gastritis, unspecified, without bleeding: Secondary | ICD-10-CM

## 2020-07-20 DIAGNOSIS — K222 Esophageal obstruction: Secondary | ICD-10-CM

## 2020-07-20 DIAGNOSIS — Z86010 Personal history of colonic polyps: Secondary | ICD-10-CM | POA: Diagnosis not present

## 2020-07-20 DIAGNOSIS — K317 Polyp of stomach and duodenum: Secondary | ICD-10-CM | POA: Diagnosis not present

## 2020-07-20 DIAGNOSIS — R1319 Other dysphagia: Secondary | ICD-10-CM

## 2020-07-20 MED ORDER — SODIUM CHLORIDE 0.9 % IV SOLN: 500.0000 mL | Freq: Once | INTRAVENOUS | Status: AC

## 2020-07-20 NOTE — Op Note (Signed)
[facility] Patient Name: Michele Nunez Procedure Date: 07/20/2020 11:09 AM MRN: [id number] Endoscopist: Justice Britain , MD Age: 64 Referring MD:  Date of Birth: Mar 21, 1956 Gender: Female Account #: [id number] Procedure:                Colonoscopy Indications:              High risk colon cancer surveillance: Personal                            history of colonic polyps Medicines:                Monitored Anesthesia Care Procedure:                Pre-Anesthesia Assessment:                           - Prior to the procedure, a History and Physical                            was performed, and patient medications and                            allergies were reviewed. The patient's tolerance of                            previous anesthesia was also reviewed. The risks                            and benefits of the procedure and the sedation                            options and risks were discussed with the patient.                            All questions were answered, and informed consent                            was obtained. Prior Anticoagulants: The patient has                            taken Xarelto (rivaroxaban), last dose was 2 days                            prior to procedure. ASA Grade Assessment: III - A                            patient with severe systemic disease. After                            reviewing the risks and benefits, the patient was                            deemed in satisfactory condition to undergo the  procedure.                           After obtaining informed consent, the colonoscope                            was passed under direct vision. Throughout the                            procedure, the patient's blood pressure, pulse, and                            oxygen saturations were monitored continuously. The                            Colonoscope was introduced through the anus and                             advanced to the 5 cm into the ileum. The                            colonoscopy was performed without difficulty. The                            patient tolerated the procedure. The quality of the                            bowel preparation was adequate. The terminal ileum,                            ileocecal valve, appendiceal orifice, and rectum                            were photographed. Scope In: 11:24:48 AM Scope Out: 11:46:22 AM Scope Withdrawal Time: 0 hours 16 minutes 47 seconds  Total Procedure Duration: 0 hours 21 minutes 34 seconds  Findings:                 The digital rectal exam findings include                            hemorrhoids and skin tags. Pertinent negatives                            include no palpable rectal lesions.                           The terminal ileum and ileocecal valve appeared                            normal.                           A single medium-sized angioectasia with typical  arborization was found in the ascending colon.                           Four sessile polyps were found in the descending                            colon (1), transverse colon (2) and cecum (1). The                            polyps were 3 to 5 mm in size. These polyps were                            removed with a cold snare. Resection and retrieval                            were complete. These polyps were removed with a                            cold snare. Resection and retrieval were complete.                           Multiple small-mouthed diverticula were found in                            the recto-sigmoid colon, sigmoid colon and                            transverse colon.                           Normal mucosa was found in the entire colon                            otherwise.                           Non-bleeding non-thrombosed external and internal                            hemorrhoids were found during  retroflexion, during                            perianal exam and during digital exam. The                            hemorrhoids were Grade II (internal hemorrhoids                            that prolapse but reduce spontaneously). Complications:            No immediate complications. Estimated Blood Loss:     Estimated blood loss was minimal. Impression:               - Hemorrhoids found on digital rectal exam.                           -  The examined portion of the ileum was normal.                           - A single colonic angioectasia.                           - Four 3 to 5 mm polyps in the descending colon, in                            the transverse colon and in the cecum, removed with                            a cold snare. Resected and retrieved.                           - Diverticulosis in the recto-sigmoid colon, in the                            sigmoid colon and in the transverse colon.                           - Normal mucosa in the entire examined colon                            otherwise.                           - Non-bleeding non-thrombosed external and internal                            hemorrhoids. Recommendation:           - The patient will be observed post-procedure,                            until all discharge criteria are met.                           - Discharge patient to home.                           - Patient has a contact number available for                            emergencies. The signs and symptoms of potential                            delayed complications were discussed with the                            patient. Return to normal activities tomorrow.                            Written discharge instructions were provided to the  patient.                           - High fiber diet.                           - Use FiberCon 1-2 tablets PO daily.                           - May restart Xarelto in 48 hours  (10/24 PM) in                            order to decrease risk of post-interventional                            bleeding. Please continue to use your support hose.                           - Continue present medications.                           - Await pathology results.                           - Repeat colonoscopy in 3/5 years for surveillance                            based on pathology results and adenomatous tissue.                           - The findings and recommendations were discussed                            with the patient. Justice Britain, MD 07/20/2020 11:58:39 AM

## 2020-07-20 NOTE — Op Note (Addendum)
[facility] Patient Name: Michele Nunez Procedure Date: 07/20/2020 11:10 AM MRN: [id number] Endoscopist: Justice Britain , MD Age: 64 Referring MD:  Date of Birth: 11/02/1955 Gender: Female Account #: [id number] Procedure:                Upper GI endoscopy Indications:              Dysphagia Medicines:                Monitored Anesthesia Care Procedure:                Pre-Anesthesia Assessment:                           - Prior to the procedure, a History and Physical                            was performed, and patient medications and                            allergies were reviewed. The patient's tolerance of                            previous anesthesia was also reviewed. The risks                            and benefits of the procedure and the sedation                            options and risks were discussed with the patient.                            All questions were answered, and informed consent                            was obtained. Prior Anticoagulants: The patient has                            taken Xarelto (rivaroxaban), last dose was 2 days                            prior to procedure. ASA Grade Assessment: III - A                            patient with severe systemic disease. After                            reviewing the risks and benefits, the patient was                            deemed in satisfactory condition to undergo the                            procedure.  After obtaining informed consent, the endoscope was                            passed under direct vision. Throughout the                            procedure, the patient's blood pressure, pulse, and                            oxygen saturations were monitored continuously. The                            Endoscope was introduced through the mouth, and                            advanced to the second part of duodenum. The upper                             GI endoscopy was accomplished without difficulty.                            The patient tolerated the procedure. Scope In: Scope Out: Findings:                 No gross lesions were noted in the entire                            esophagus. Biopsies were taken with a cold forceps                            for histology to rule out EoE/LoE. After the rest                            of the EGD was complete, a guidewire was placed and                            the scope was withdrawn. Dilation was performed                            with a Savary dilator with no resistance at 18 mm.                            The dilation site was examined following endoscope                            reinsertion and showed no change.                           The Z-line was regular and was found 40 cm from the                            incisors.  Multiple gastric polyps - likely hyperplastic or                            fundic gland. Biopsied to rule out adenoma larger                            polyps.                           Striped mildly erythematous mucosa without bleeding                            was found in the gastric body and in the gastric                            antrum.                           No other gross lesions were noted in the entire                            examined stomach. Biopsies were taken with a cold                            forceps for histology and Helicobacter pylori                            testing.                           No gross lesions were noted in the duodenal bulb,                            in the first portion of the duodenum and in the                            second portion of the duodenum. Complications:            No immediate complications. Estimated Blood Loss:     Estimated blood loss was minimal. Impression:               - No gross lesions in esophagus. Biopsied. Dilated.                           -  Z-line regular, 40 cm from the incisors.                           - Gastric polyps - likely hyperplastic or fundic                            gland. Biopsied.                           - Erythematous mucosa in the gastric body and  antrum. No gross lesions in the stomach. Biopsied                            for HP.                           - No gross lesions in the duodenal bulb, in the                            first portion of the duodenum and in the second                            portion of the duodenum. Recommendation:           - Proceed to scheduled colonoscopy.                           - Dilation diet as per protocol.                           - Please use Cepacol or Halls Lozenges +/-                            Chloraseptic spray for next 72-96 hours to aid in                            sore thoat should you experience this.                           - Continue present medications.                           - Await pathology results.                           - Repeat upper endoscopy PRN for retreatment if                            patient has improvement for a lasting period in                            time.                           - If recurrent dysphagia symptoms, then manometry                            would be next steps in evaluation.                           - The findings and recommendations were discussed                            with the patient. Justice Britain, MD 07/20/2020 11:52:06 AM

## 2020-07-20 NOTE — Patient Instructions (Signed)
Diverticulosis, Hemorrhoids, Polyps, Gastritis, Esophageal stricture with Dilation-Follow Dilation diet, High Fiber Diet. (Handouts given)  Restart Xarelto on 10/24   YOU HAD AN ENDOSCOPIC PROCEDURE TODAY AT [facility]:   Refer to the procedure report that was given to you for any specific questions about what was found during the examination.  If the procedure report does not answer your questions, please call your gastroenterologist to clarify.  If you requested that your care partner not be given the details of your procedure findings, then the procedure report has been included in a sealed envelope for you to review at your convenience later.  YOU SHOULD EXPECT: Some feelings of bloating in the abdomen. Passage of more gas than usual.  Walking can help get rid of the air that was put into your GI tract during the procedure and reduce the bloating. If you had a lower endoscopy (such as a colonoscopy or flexible sigmoidoscopy) you may notice spotting of blood in your stool or on the toilet paper. If you underwent a bowel prep for your procedure, you may not have a normal bowel movement for a few days.  Please Note:  You might notice some irritation and congestion in your nose or some drainage.  This is from the oxygen used during your procedure.  There is no need for concern and it should clear up in a day or so.  SYMPTOMS TO REPORT IMMEDIATELY:   Following lower endoscopy (colonoscopy or flexible sigmoidoscopy):  Excessive amounts of blood in the stool  Significant tenderness or worsening of abdominal pains  Swelling of the abdomen that is new, acute  Fever of 100F or higher   Following upper endoscopy (EGD)  Vomiting of blood or coffee ground material  New chest pain or pain under the shoulder blades  Painful or persistently difficult swallowing  New shortness of breath  Fever of 100F or higher  Black, tarry-looking stools  For urgent or emergent issues, a  gastroenterologist can be reached at any hour by calling [phone]. Do not use MyChart messaging for urgent concerns.    DIET:  We do recommend a small meal at first, but then you may proceed to your regular diet.  Drink plenty of fluids but you should avoid alcoholic beverages for 24 hours.  ACTIVITY:  You should plan to take it easy for the rest of today and you should NOT DRIVE or use heavy machinery until tomorrow (because of the sedation medicines used during the test).    FOLLOW UP: Our staff will call the number listed on your records 48-72 hours following your procedure to check on you and address any questions or concerns that you may have regarding the information given to you following your procedure. If we do not reach you, we will leave a message.  We will attempt to reach you two times.  During this call, we will ask if you have developed any symptoms of COVID 19. If you develop any symptoms (ie: fever, flu-like symptoms, shortness of breath, cough etc.) before then, please call [phone].  If you test positive for Covid 19 in the 2 weeks post procedure, please call and report this information to [place].    If any biopsies were taken you will be contacted by phone or by letter within the next 1-3 weeks.  Please call us at [phone] if you have not heard about the biopsies in 3 weeks.    SIGNATURES/CONFIDENTIALITY: You and/or your care partner have signed paperwork  which will be entered into your electronic medical record.  These signatures attest to the fact that that the information above on your After Visit Summary has been reviewed and is understood.  Full responsibility of the confidentiality of this discharge information lies with you and/or your care-partner.

## 2020-07-20 NOTE — Progress Notes (Signed)
Called to room to assist during endoscopic procedure.  Patient ID and intended procedure confirmed with present staff. Received instructions for my participation in the procedure from the performing physician.  

## 2020-07-20 NOTE — Progress Notes (Signed)
1110 Robinul 0.1 mg IV given due large amount of secretions upon assessment.  MD made aware, vss 

## 2020-07-20 NOTE — Progress Notes (Signed)
Report given to PACU, vss 

## 2020-07-20 NOTE — Progress Notes (Signed)
Vitals-CW  Pt's states no medical or surgical changes since previsit or office visit. 

## 2020-07-20 NOTE — Progress Notes (Signed)
1115 Ephedrine 10 mg given IV due to low BP, MD updated.  

## 2020-07-24 ENCOUNTER — Telehealth: Payer: Self-pay

## 2020-07-24 NOTE — Telephone Encounter (Signed)
  Follow up Call-  Call back number 07/20/2020  Post procedure Call Back phone  # [phone]  Permission to leave phone message Yes  Some recent data might be hidden     Patient questions:  Do you have a fever, pain , or abdominal swelling? No. Pain Score  0 *  Have you tolerated food without any problems? Yes.    Have you been able to return to your normal activities? Yes.    Do you have any questions about your discharge instructions: Diet   No. Medications  No. Follow up visit  No.  Do you have questions or concerns about your Care? No.  Actions: * If pain score is 4 or above: No action needed, pain <4.  1. Have you developed a fever since your procedure? no  2.   Have you had an respiratory symptoms (SOB or cough) since your procedure? no  3.   Have you tested positive for COVID 19 since your procedure no  4.   Have you had any family members/close contacts diagnosed with the COVID 19 since your procedure?  no   If yes to any of these questions please route to Joylene John, RN and Joella Prince, RN

## 2020-07-24 NOTE — Telephone Encounter (Signed)
Called [phone] and the call would not go through. maw

## 2020-07-31 ENCOUNTER — Telehealth: Payer: Self-pay | Admitting: Gastroenterology

## 2020-07-31 NOTE — Telephone Encounter (Signed)
Pt is requesting a call back from a nurse to discuss her results from her procedure.

## 2020-07-31 NOTE — Telephone Encounter (Signed)
The pt has been advised that Dr Rush Landmark has not reviewed the pathology and as soon as he does we will contact her.  The pt has been advised of the information and verbalized understanding.

## 2020-08-01 ENCOUNTER — Encounter: Payer: Self-pay | Admitting: Gastroenterology

## 2024-03-17 NOTE — Progress Notes (Signed)
 03/18/2024 KYRA LAFFEY [id number] 03/24/1956  Referring provider: Irven Ozell DEL, MD Primary GI doctor: Dr. Wilhelmenia  ASSESSMENT AND PLAN:  Anemia per records improved with iron 09/2023 HGB 10.4, MCV 90 B12 542, folate 20, no iron or ferritin Started on iron, HGB improved to 12.8 on 05/21/2023, 12.6 on 02/2024 On iron, she was giving regular blood donation until Jan when she had the anemia - will repeat iron/ferritin -Will get EGD with dilation and colonoscopy, patient also have dysphagia and is due for colon Risk of bowel prep, conscious sedation, and EGD and colonoscopy were discussed.  Risks include but are not limited to dehydration, pain, bleeding, cardiopulmonary process, bowel perforation, or other possible adverse outcomes..  Treatment plan was discussed with patient, and agreed upon.  Dysphagia with GERD, history of empiric dilation 2021, progressively worse last 3-4 months, no issues with liquids 07/20/2020 EGD gastric polyps, gastritis normal esophagus empirically dilated On omeprazole 20 mg x 2-3 years, continue for now -Schedule EGD with dilatation to evaluate for stenosis, tumor, erosive/infectious esophagititis, and EOE. I discussed risks of EGD with patient today, including risk of sedation, bleeding or perforation.  Patient provides understanding and gave verbal consent to proceed. - consider barium swallow -In the interim patient advised about swallowing precautions.  -Eat slowly, chew food well before swallowing.  -Drink liquids in between each bite to avoid food impaction. - ER precautions discussed with the patient  Personal history of colon polyps 07/20/2020 colonoscopy Dr. Wilhelmenia hemorrhoids, normal TI, single colonic angiectasia, 4 polyps 3 to 5 mm descending colon transverse cecum, diverticulosis rectosigmoid colon, nonbleeding internal/external hemorrhoids combination of benign tissue and TA polyps recall 3 years - overdue for  recall  History of PE On Xarelto  prescribed by Dr. Harvey Hold Xarelto  for 2 days before procedure  will instruct when and how to resume after procedure.  Patient understands that there is a low but real risk of cardiovascular event such as heart attack, stroke, or embolism /  thrombosis, or ischemia while off Xarelto .  The patient consents to proceed.  Will communicate by phone or EMR with patient's prescribing provider to confirm that holding Xarelto  is reasonable in this case.   Elevated LFTs 01/2019 RUQ US  hepatic steatosis    Latest Ref Rng & Units 08/07/2014   10:06 AM 07/04/2014    4:50 PM 06/17/2014    7:38 AM  Hepatic Function  Total Protein 6.0 - 8.3 g/dL 7.3  7.8  7.1   Albumin 3.5 - 5.2 g/dL 3.7  4.3  3.5   AST 0 - 37 U/L 23  20  15    ALT 0 - 35 U/L 21  21  14    Alk Phosphatase 39 - 117 U/L 66  83  85   Total Bilirubin 0.2 - 1.2 mg/dL 0.8  0.4  0.7   Bilirubin, Direct 0.0 - 0.3 mg/dL 0.1  0.1  <9.7   91/77/7975 platlets 291, AST 18, ALT 18 FIB4 = 1.01 low risk - need LFTs and CBC monitored every 6 months, - evaluation with imaging every 2-3 years, consider repeat -Continue to work on risk factor modification including diet exercise and control of risk factors including blood sugars.  Morbid obesity  Body mass index is 42.13 kg/m.  -Patient has been advised to make an attempt to improve diet and exercise patterns to aid in weight loss. -Recommended diet heavy in fruits and veggies and low in animal meats, cheeses, and dairy products, appropriate calorie intake  Patient Care Team: Irven Ozell DEL, MD as PCP - General (Family Medicine)  HISTORY OF PRESENT ILLNESS: 68 y.o. female with a past medical history listed below presents for evaluation of dysphagia.   Last seen in the office 06/2020 by Dr. Wilhelmenia for dysphagia, GERD, iron deficiency.  Discussed the use of AI scribe software for clinical note transcription with the patient, who gave verbal consent to  proceed.  History of Present Illness   RAMYA VANBERGEN is a 68 year old female with dysphagia who presents with worsening swallowing difficulties.  She has been experiencing a recurrence of swallowing difficulties that have worsened over the past two to four months. There is a sensation of food getting stuck in her throat, necessitating fluid intake to aid passage, followed by prolonged coughing for 20 to 30 minutes after eating. No regurgitation or issues with liquids are noted, and the difficulty is not painful unless she forces food down with water. She has a history of endoscopic dilation in 2021, which initially alleviated her symptoms.  She is currently taking 20 mg of omeprazole daily for acid reflux, which has reduced her heartburn symptoms. No hoarseness or excessive mucus production is reported.  She has a history of colon polyps, with a colonoscopy in October 2021 revealing four polyps, some of which were precancerous.  She has experienced blood clots post-surgery on multiple occasions, leading to the use of Xarelto . She reports being anemic and is on an iron supplement, noting that her stool is dark but not bloody. Her hemoglobin improved from 10.4 to 12.6 after starting iron supplementation.  She experienced a significant increase in blood pressure two to three months ago, reaching 195/98, which led to an emergency room visit. She was subsequently started on beta-blockers and a diuretic, which have stabilized her blood pressure to around 120/62. No current chest pain or shortness of breath is reported.  She has a history of neuropathy and was previously prescribed medications by a neurologist, which she discontinued due to adverse effects such as dizziness and increased blood pressure.        She  reports that she quit smoking about 35 years ago. Her smoking use included cigarettes. She started smoking about 55 years ago. She has a 20 pack-year smoking history. She has never used  smokeless tobacco. She reports that she does not drink alcohol and does not use drugs.  RELEVANT GI HISTORY, IMAGING AND LABS: Results   LABS Hb: 10.4 (09/2023) Hb: 12.6 (03/02/2024) B12: Normal (09/2023) Folate: Normal (09/2023)  DIAGNOSTIC Endoscopy: Esophageal dilation, gastritis (2021) Colonoscopy: Four polyps, some precancerous (06/2020) Stress test: Normal     Diagnosis 1. Surgical [P], gastric antrum and gastric body - BENIGN GASTRIC MUCOSA - NO H. PYLORI, INTESTINAL METAPLASIA OR MALIGNANCY IDENTIFIED 2. Surgical [P], gastric polyps - BENIGN GASTRIC MUCOSA - NO H. PYLORI, INTESTINAL METAPLASIA OR MALIGNANCY IDENTIFIED 3. Surgical [P], mid esophagus and distal esophagus - BENIGN SQUAMOUS MUCOSA - NO INCREASED INTRAEPITHELIAL EOSINOPHILS  CBC    Component Value Date/Time   WBC 8.3 07/06/2020 1107   RBC 4.15 07/06/2020 1107   HGB 12.1 07/06/2020 1107   HCT 36.7 07/06/2020 1107   PLT 336.0 07/06/2020 1107   MCV 88.5 07/06/2020 1107   MCH 27.8 06/19/2014 0356   MCHC 33.1 07/06/2020 1107   RDW 12.8 07/06/2020 1107   LYMPHSABS 3.2 07/04/2014 1650   MONOABS 0.7 07/04/2014 1650   EOSABS 0.2 07/04/2014 1650   BASOSABS 0.0 07/04/2014 1650   No results  for input(s): HGB in the last 8760 hours.  CMP     Component Value Date/Time   NA 140 07/06/2020 1107   K 3.9 07/06/2020 1107   CL 101 07/06/2020 1107   CO2 31 07/06/2020 1107   GLUCOSE 93 07/06/2020 1107   BUN 21 07/06/2020 1107   CREATININE 1.01 07/06/2020 1107   CREATININE 0.91 04/18/2014 1411   CALCIUM  10.2 07/06/2020 1107   PROT 7.3 08/07/2014 1006   ALBUMIN 3.7 08/07/2014 1006   AST 23 08/07/2014 1006   ALT 21 08/07/2014 1006   ALKPHOS 66 08/07/2014 1006   BILITOT 0.8 08/07/2014 1006   GFRNONAA 69 (L) 06/20/2014 0500   GFRAA 80 (L) 06/20/2014 0500      Latest Ref Rng & Units 08/07/2014   10:06 AM 07/04/2014    4:50 PM 06/17/2014    7:38 AM  Hepatic Function  Total Protein 6.0 - 8.3 g/dL 7.3  7.8   7.1   Albumin 3.5 - 5.2 g/dL 3.7  4.3  3.5   AST 0 - 37 U/L 23  20  15    ALT 0 - 35 U/L 21  21  14    Alk Phosphatase 39 - 117 U/L 66  83  85   Total Bilirubin 0.2 - 1.2 mg/dL 0.8  0.4  0.7   Bilirubin, Direct 0.0 - 0.3 mg/dL 0.1  0.1  <9.7       Current Medications:     Current Outpatient Medications (Cardiovascular):    hydrochlorothiazide (HYDRODIURIL) 25 MG tablet, Take 25 mg by mouth daily. Takes 1/2 pill   irbesartan (AVAPRO) 150 MG tablet, Take 150 mg by mouth daily. (Patient taking differently: Take 300 mg by mouth daily.)   metoprolol tartrate (LOPRESSOR) 50 MG tablet, Take 50 mg by mouth daily.   rosuvastatin (CRESTOR) 10 MG tablet, Take 10 mg by mouth daily.   spironolactone (ALDACTONE) 50 MG tablet, Take 50 mg by mouth daily.       Current Outpatient Medications (Hematological):    FERROCITE 324 MG TABS tablet, Take 1 tablet by mouth daily.   XARELTO  20 MG TABS tablet, TAKE 1 TABLET BY MOUTH EVERY EVENING WITH SUPPER   Current Outpatient Medications (Other):    Multiple Vitamin (MULTIVITAMIN) tablet, Take 1 tablet by mouth daily.   Na Sulfate-K Sulfate-Mg Sulfate concentrate (SUPREP) 17.5-3.13-1.6 GM/177ML SOLN, Take 1 kit (354 mLs total) by mouth once for 1 dose.   omeprazole (PRILOSEC) 40 MG capsule, Take 40 mg by mouth every morning.  Current Facility-Administered Medications (Other):    0.9 %  sodium chloride  infusion  Medical History:  Past Medical History:  Diagnosis Date   Anxiety    panic attacks   Arthritis    probably   Complication of anesthesia    could not breath and heard the doctor saying she was turning blue-woke up with bruises on chest and very sore for kidney stone surgery-anesthesia record on chart   Gallstones    GERD (gastroesophageal reflux disease)    History of kidney stones    Hypercholesteremia    mild   Hyperparathyroidism (HCC)    Hyperthyroidism    Parathyroid  adenoma    Pulmonary embolism (HCC)    Shortness of  breath    with activity   Status post dilation of esophageal narrowing    Allergies: No Known Allergies   Surgical History:  She  has a past surgical history that includes Kidney stone surgery (08/2013); Wrist fracture surgery (Right, 1995); Cardiac catheterization (  20 years ago); Cholecystectomy (09/2013); Colonoscopy; Finger surgery (Right); Tonsillectomy (in 20's); Minimally invasive radioactive parathyroidectomy (N/A, 05/31/2014); Ankle surgery (Left); Total knee arthroplasty (Left); and Tibia fracture surgery (Right). Family History:  Her family history includes Diabetes in her maternal grandmother and mother; Heart disease in her father; Lymphoma in her son; Ovarian cancer in her sister.  REVIEW OF SYSTEMS  : All other systems reviewed and negative except where noted in the History of Present Illness.  PHYSICAL EXAM: BP 132/72   Pulse 68   Ht 5' 7 (1.702 m)   Wt 269 lb (122 kg)   BMI 42.13 kg/m  Physical Exam   GENERAL APPEARANCE: Well nourished, in no apparent distress. HEENT: No cervical lymphadenopathy, unremarkable thyroid, sclerae anicteric, conjunctiva pink. RESPIRATORY: Respiratory effort normal, breath sounds equal bilaterally without rales, rhonchi, or wheezing. Lungs normal. CARDIO: Regular rate and rhythm with no murmurs, rubs, or gallops, peripheral pulses intact. ABDOMEN: Soft, non-distended, active bowel sounds in all four quadrants, no tenderness to palpation, no rebound, no mass appreciated. Abdomen normal, no pain. RECTAL: Declines. MUSCULOSKELETAL: Full range of motion, normal gait, without edema. SKIN: Dry, intact without rashes or lesions. No jaundice. NEURO: Alert, oriented, no focal deficits. PSYCH: Cooperative, normal mood and affect.      Alan JONELLE Coombs, PA-C 11:11 AM

## 2024-03-18 ENCOUNTER — Encounter: Payer: Self-pay | Admitting: Physician Assistant

## 2024-03-18 ENCOUNTER — Ambulatory Visit: Payer: Self-pay | Admitting: Physician Assistant

## 2024-03-18 ENCOUNTER — Encounter: Payer: Self-pay | Admitting: *Deleted

## 2024-03-18 ENCOUNTER — Other Ambulatory Visit

## 2024-03-18 ENCOUNTER — Ambulatory Visit: Admitting: Physician Assistant

## 2024-03-18 ENCOUNTER — Telehealth: Payer: Self-pay | Admitting: *Deleted

## 2024-03-18 VITALS — BP 132/72 | HR 68 | Ht 67.0 in | Wt 269.0 lb

## 2024-03-18 DIAGNOSIS — K219 Gastro-esophageal reflux disease without esophagitis: Secondary | ICD-10-CM

## 2024-03-18 DIAGNOSIS — D649 Anemia, unspecified: Secondary | ICD-10-CM | POA: Diagnosis not present

## 2024-03-18 DIAGNOSIS — Z86718 Personal history of other venous thrombosis and embolism: Secondary | ICD-10-CM

## 2024-03-18 DIAGNOSIS — Z860101 Personal history of adenomatous and serrated colon polyps: Secondary | ICD-10-CM | POA: Diagnosis not present

## 2024-03-18 DIAGNOSIS — R1310 Dysphagia, unspecified: Secondary | ICD-10-CM | POA: Diagnosis not present

## 2024-03-18 DIAGNOSIS — Z86711 Personal history of pulmonary embolism: Secondary | ICD-10-CM

## 2024-03-18 LAB — IBC + FERRITIN
Ferritin: 31.8 ng/mL (ref 10.0–291.0)
Iron: 142 ug/dL (ref 42–145)
Saturation Ratios: 30.6 % (ref 20.0–50.0)
TIBC: 463.4 ug/dL — ABNORMAL HIGH (ref 250.0–450.0)
Transferrin: 331 mg/dL (ref 212.0–360.0)

## 2024-03-18 MED ORDER — NA SULFATE-K SULFATE-MG SULF 17.5-3.13-1.6 GM/177ML PO SOLN: 1.0000 | Freq: Once | ORAL | 0 refills | Status: AC

## 2024-03-18 NOTE — Telephone Encounter (Signed)
  Michele Nunez 1956-06-21 [id number]  03/18/2024   Dear Lilly Reiter MD,:  We have scheduled the above named patient for a(n) Colonoscopy/Endoscopy procedure. Our records show that she is on anticoagulation therapy.  Please advise as to whether the patient may come off their therapy of Xarelto  2 days prior to their procedure which is scheduled for 04/22/2024.  Please route your response to Royanne Core, [facility]  or fax response to [phone].  Sincerely,    [facility]

## 2024-03-18 NOTE — Patient Instructions (Addendum)
 Your provider has requested that you go to the basement level for lab work before leaving today. Press B on the elevator. The lab is located at the first door on the left as you exit the elevator.  Dysphagia precautions:  1. Take reflux medications 30+ minutes before food in the morning 2. Begin meals with warm beverage 3. Eat smaller more frequent meals 4. Eat slowly, taking small bites and sips 5. Alternate solids and liquids 6. Avoid foods/liquids that increase acid production 7. Sit upright during and for 30+ minutes after meals to facilitate esophageal clearing 8. All meats should be chopped finely.   If something gets hung in your esophagus and will not come up or go down, proceed to the emergency room.    You will be contacted by our office prior to your procedure for directions on holding your Xarelto .  If you do not hear from our office 1 week prior to your scheduled procedure, please call [phone] to discuss.   You have been scheduled for an endoscopy and colonoscopy. Please follow the written instructions given to you at your visit today.  If you use inhalers (even only as needed), please bring them with you on the day of your procedure.  DO NOT TAKE 7 DAYS PRIOR TO TEST- Trulicity (dulaglutide) Ozempic, Wegovy (semaglutide) Mounjaro (tirzepatide) Bydureon Bcise (exanatide extended release)  DO NOT TAKE 1 DAY PRIOR TO YOUR TEST Rybelsus (semaglutide) Adlyxin (lixisenatide) Victoza (liraglutide) Byetta (exanatide) ___________________________________________________________________________   Due to recent changes in healthcare laws, you may see the results of your imaging and laboratory studies on MyChart before your provider has had a chance to review them.  We understand that in some cases there may be results that are confusing or concerning to you. Not all laboratory results come back in the same time frame and the provider may be waiting for multiple results in  order to interpret others.  Please give us  48 hours in order for your provider to thoroughly review all the results before contacting the office for clarification of your results.   _______________________________________________________  If your blood pressure at your visit was 140/90 or greater, please contact your primary care physician to follow up on this.  _______________________________________________________  If you are age 70 or older, your body mass index should be between 23-30. Your Body mass index is 42.13 kg/m. If this is out of the aforementioned range listed, please consider follow up with your Primary Care Provider.  If you are age 70 or younger, your body mass index should be between 19-25. Your Body mass index is 42.13 kg/m. If this is out of the aformentioned range listed, please consider follow up with your Primary Care Provider.   ________________________________________________________  The [facility] providers would like to encourage you to use MYCHART to communicate with providers for non-urgent requests or questions.  Due to long hold times on the telephone, sending your provider a message by [facility] may be a faster and more efficient way to get a response.  Please allow 48 business hours for a response.  Please remember that this is for non-urgent requests.  _______________________________________________________    I appreciate the  opportunity to care for you  Thank You   [facility]

## 2024-03-19 NOTE — Progress Notes (Signed)
 Attending Physician's Attestation   I have reviewed the chart.   I agree with the Advanced Practitioner's note, impression, and recommendations with any updates as below.    Corliss Parish, MD [facility] Advanced Endoscopy Office # [phone]

## 2024-04-04 NOTE — Telephone Encounter (Signed)
 Patient advised that she has been given clearance to hold Xarelto  2 days prior to endo/colon scheduled for 04-22-24.  Patient advised to take last dose of Xarelto  on 04-19-24, and she will be advised when to restart Xarelto  by [name] after the procedure.  Patient agreed to plan and verbalized understanding.  No further questions.

## 2024-04-22 ENCOUNTER — Ambulatory Visit: Admitting: Gastroenterology

## 2024-04-22 ENCOUNTER — Encounter: Payer: Self-pay | Admitting: Gastroenterology

## 2024-04-22 VITALS — BP 123/60 | HR 60 | Temp 97.9°F | Resp 16 | Ht 67.0 in | Wt 269.0 lb

## 2024-04-22 DIAGNOSIS — K562 Volvulus: Secondary | ICD-10-CM | POA: Diagnosis not present

## 2024-04-22 DIAGNOSIS — K2289 Other specified disease of esophagus: Secondary | ICD-10-CM | POA: Diagnosis not present

## 2024-04-22 DIAGNOSIS — Z860101 Personal history of adenomatous and serrated colon polyps: Secondary | ICD-10-CM

## 2024-04-22 DIAGNOSIS — K644 Residual hemorrhoidal skin tags: Secondary | ICD-10-CM | POA: Diagnosis not present

## 2024-04-22 DIAGNOSIS — K3189 Other diseases of stomach and duodenum: Secondary | ICD-10-CM

## 2024-04-22 DIAGNOSIS — D122 Benign neoplasm of ascending colon: Secondary | ICD-10-CM

## 2024-04-22 DIAGNOSIS — Z1211 Encounter for screening for malignant neoplasm of colon: Secondary | ICD-10-CM

## 2024-04-22 DIAGNOSIS — K5520 Angiodysplasia of colon without hemorrhage: Secondary | ICD-10-CM | POA: Diagnosis not present

## 2024-04-22 DIAGNOSIS — K5730 Diverticulosis of large intestine without perforation or abscess without bleeding: Secondary | ICD-10-CM

## 2024-04-22 DIAGNOSIS — R1310 Dysphagia, unspecified: Secondary | ICD-10-CM

## 2024-04-22 DIAGNOSIS — D649 Anemia, unspecified: Secondary | ICD-10-CM

## 2024-04-22 DIAGNOSIS — K641 Second degree hemorrhoids: Secondary | ICD-10-CM | POA: Diagnosis not present

## 2024-04-22 DIAGNOSIS — K317 Polyp of stomach and duodenum: Secondary | ICD-10-CM

## 2024-04-22 MED ORDER — SODIUM CHLORIDE 0.9 % IV SOLN: 500.0000 mL | Freq: Once | INTRAVENOUS | Status: DC

## 2024-04-22 NOTE — Op Note (Addendum)
 [facility] Patient Name: Michele Nunez Procedure Date: 04/22/2024 1:49 PM MRN: [id number] Endoscopist: Aloha Finner , MD, [id number] Age: 68 Referring MD:  Date of Birth: [birth year] Gender: Female Account #: [id number] Procedure:                Colonoscopy Indications:              Surveillance: Personal history of adenomatous                            polyps on last colonoscopy > 3 years ago Medicines:                Monitored Anesthesia Care Procedure:                Pre-Anesthesia Assessment:                           - Prior to the procedure, a History and Physical                            was performed, and patient medications and                            allergies were reviewed. The patient's tolerance of                            previous anesthesia was also reviewed. The risks                            and benefits of the procedure and the sedation                            options and risks were discussed with the patient.                            All questions were answered, and informed consent                            was obtained. Prior Anticoagulants: The patient has                            taken no anticoagulant or antiplatelet agents. ASA                            Grade Assessment: III - A patient with severe                            systemic disease. After reviewing the risks and                            benefits, the patient was deemed in satisfactory                            condition to undergo the procedure.  After obtaining informed consent, the colonoscope                            was passed under direct vision. Throughout the                            procedure, the patient's blood pressure, pulse, and                            oxygen saturations were monitored continuously. The                            CF [id number] #[id number] was introduced through the anus                            and  advanced to the 3 cm into the ileum. The                            colonoscopy was performed without difficulty. The                            patient tolerated the procedure. The quality of the                            bowel preparation was adequate. The terminal ileum,                            ileocecal valve, appendiceal orifice, and rectum                            were photographed. Scope In: 2:14:24 PM Scope Out: 2:33:10 PM Scope Withdrawal Time: 0 hours 13 minutes 26 seconds  Total Procedure Duration: 0 hours 18 minutes 46 seconds  Findings:                 Skin tags were found on perianal exam.                           The digital rectal exam findings include                            hemorrhoids. Pertinent negatives include no                            palpable rectal lesions.                           The colon (entire examined portion) revealed                            moderately excessive looping.                           The terminal ileum and ileocecal valve appeared  normal.                           A single medium-sized angioectasia with typical                            arborization was found in the ascending colon.                           A 4 mm polyp was found in the proximal ascending                            colon. The polyp was sessile. The polyp was removed                            with a cold snare. Resection and retrieval were                            complete.                           Scattered small-mouthed diverticula were found in                            the entire colon (left-sided burning greater than                            right).                           Normal mucosa was found in the entire colon                            otherwise.                           Non-bleeding non-thrombosed internal hemorrhoids                            were found during retroflexion, during perianal                             exam and during digital exam. The hemorrhoids were                            Grade II (internal hemorrhoids that prolapse but                            reduce spontaneously). Complications:            No immediate complications. Estimated Blood Loss:     Estimated blood loss was minimal. Impression:               - Perianal skin tags found on perianal exam.                            Hemorrhoids found on  digital rectal exam.                           - There was significant looping of the colon.                           - The examined portion of the ileum was normal.                           - A single colonic angioectasia in ascending colon.                           - One 4 mm polyp in the proximal ascending colon,                            removed with a cold snare. Resected and retrieved.                           - Diverticulosis in the entire examined colon                            (left-sided burning greater than right).                           - Normal mucosa in the entire examined colon                            otherwise.                           - Non-bleeding non-thrombosed internal hemorrhoids. Recommendation:           - The patient will be observed post-procedure,                            until all discharge criteria are met.                           - Discharge patient to home.                           - Patient has a contact number available for                            emergencies. The signs and symptoms of potential                            delayed complications were discussed with the                            patient. Return to normal activities tomorrow.                            Written discharge instructions were provided to the  patient.                           - High fiber diet.                           - Use FiberCon 1-2 tablets PO daily.                           - Xarelto  restart on 7/26 PM.                            - Continue present medications.                           - Await pathology results.                           - Repeat colonoscopy in 5-7 years for surveillance.                           - The findings and recommendations were discussed                            with the patient.                           - The findings and recommendations were discussed                            with the patient's family. Aloha Finner, MD 04/22/2024 2:43:41 PM

## 2024-04-22 NOTE — Progress Notes (Signed)
 To pacu, VSS. Report to [email or web address]

## 2024-04-22 NOTE — Progress Notes (Signed)
 GASTROENTEROLOGY PROCEDURE H&P NOTE   Primary Care Physician: Irven Ozell DEL, MD  HPI: Michele Nunez is a 68 y.o. female who presents for EGD/colonoscopy for evaluation of dysphagia and polyp surveillance.  Past Medical History:  Diagnosis Date   Anxiety    panic attacks   Arthritis    probably   Complication of anesthesia    could not breath and heard the doctor saying she was turning blue-woke up with bruises on chest and very sore for kidney stone surgery-anesthesia record on chart   Gallstones    GERD (gastroesophageal reflux disease)    History of kidney stones    Hypercholesteremia    mild   Hyperparathyroidism (HCC)    Hyperthyroidism    Parathyroid  adenoma    Pulmonary embolism (HCC)    Shortness of breath    with activity   Status post dilation of esophageal narrowing    Past Surgical History:  Procedure Laterality Date   ANKLE SURGERY Left    CARDIAC CATHETERIZATION  20 years ago   clear   CHOLECYSTECTOMY  09/2013   COLONOSCOPY     FINGER SURGERY Right    ring finger-pin   KIDNEY STONE SURGERY  08/2013   MINIMALLY INVASIVE RADIOACTIVE PARATHYROIDECTOMY N/A 05/31/2014   Procedure: LEFT INFERIOR PARATHYROIDECTOMY ;  Surgeon: Elon Pacini, MD;  Location: [facility];  Service: General;  Laterality: N/A;   TIBIA FRACTURE SURGERY Right    TONSILLECTOMY  in 20's   TOTAL KNEE ARTHROPLASTY Left    WRIST FRACTURE SURGERY Right 1995   Current Outpatient Medications  Medication Sig Dispense Refill   FERROCITE 324 MG TABS tablet Take 1 tablet by mouth daily.     hydrochlorothiazide (HYDRODIURIL) 25 MG tablet Take 25 mg by mouth daily. Takes 1/2 pill     irbesartan (AVAPRO) 150 MG tablet Take 150 mg by mouth daily. (Patient taking differently: Take 300 mg by mouth daily.)     metoprolol tartrate (LOPRESSOR) 50 MG tablet Take 50 mg by mouth daily.     Multiple Vitamin (MULTIVITAMIN) tablet Take 1 tablet by mouth daily.     omeprazole (PRILOSEC) 40 MG  capsule Take 40 mg by mouth every morning.     rosuvastatin (CRESTOR) 10 MG tablet Take 10 mg by mouth daily.     spironolactone (ALDACTONE) 50 MG tablet Take 50 mg by mouth daily.     XARELTO  20 MG TABS tablet TAKE 1 TABLET BY MOUTH EVERY EVENING WITH SUPPER 30 tablet 0   Current Facility-Administered Medications  Medication Dose Route Frequency Provider Last Rate Last Admin   0.9 %  sodium chloride  infusion  500 mL Intravenous Once [name], Randel Hargens Jr., MD        Current Outpatient Medications:    FERROCITE 324 MG TABS tablet, Take 1 tablet by mouth daily., Disp: , Rfl:    hydrochlorothiazide (HYDRODIURIL) 25 MG tablet, Take 25 mg by mouth daily. Takes 1/2 pill, Disp: , Rfl:    irbesartan (AVAPRO) 150 MG tablet, Take 150 mg by mouth daily. (Patient taking differently: Take 300 mg by mouth daily.), Disp: , Rfl:    metoprolol tartrate (LOPRESSOR) 50 MG tablet, Take 50 mg by mouth daily., Disp: , Rfl:    Multiple Vitamin (MULTIVITAMIN) tablet, Take 1 tablet by mouth daily., Disp: , Rfl:    omeprazole (PRILOSEC) 40 MG capsule, Take 40 mg by mouth every morning., Disp: , Rfl:    rosuvastatin (CRESTOR) 10 MG tablet, Take 10 mg by mouth daily.,  Disp: , Rfl:    spironolactone (ALDACTONE) 50 MG tablet, Take 50 mg by mouth daily., Disp: , Rfl:    XARELTO  20 MG TABS tablet, TAKE 1 TABLET BY MOUTH EVERY EVENING WITH SUPPER, Disp: 30 tablet, Rfl: 0  Current Facility-Administered Medications:    0.9 %  sodium chloride  infusion, 500 mL, Intravenous, Once, [name], [name]., MD No Known Allergies Family History  Problem Relation Age of Onset   Diabetes Mother    Heart disease Father    Ovarian cancer Sister    Diabetes Maternal Grandmother    Lymphoma Son    Colon cancer Neg Hx    Esophageal cancer Neg Hx    Inflammatory bowel disease Neg Hx    Liver disease Neg Hx    Pancreatic cancer Neg Hx    Rectal cancer Neg Hx    Stomach cancer Neg Hx    Social History   Socioeconomic  History   Marital status: Married    Spouse name: Not on file   Number of children: 1   Years of education: Not on file   Highest education level: Not on file  Occupational History   Occupation: Transport planner  Tobacco Use   Smoking status: Former    Current packs/day: 0.00    Average packs/day: 1 pack/day for 20.0 years (20.0 ttl pk-yrs)    Types: Cigarettes    Start date: 09/29/1968    Quit date: 09/29/1988    Years since quitting: 35.5   Smokeless tobacco: Never  Vaping Use   Vaping status: Never Used  Substance and Sexual Activity   Alcohol use: No   Drug use: No   Sexual activity: Not on file  Other Topics Concern   Not on file  Social History Narrative   Not on file   Social Drivers of Health   Financial Resource Strain: Not on file  Food Insecurity: Not on file  Transportation Needs: Not on file  Physical Activity: Not on file  Stress: Not on file  Social Connections: Not on file  Intimate Partner Violence: Not on file    Physical Exam: There were no vitals filed for this visit. There is no height or weight on file to calculate BMI. GEN: NAD EYE: Sclerae anicteric ENT: MMM CV: Non-tachycardic GI: Soft, NT/ND NEURO:  Alert & Oriented x 3  Lab Results: No results for input(s): WBC, HGB, HCT, PLT in the last 72 hours. BMET No results for input(s): NA, K, CL, CO2, GLUCOSE, BUN, CREATININE, CALCIUM  in the last 72 hours. LFT No results for input(s): PROT, ALBUMIN, AST, ALT, ALKPHOS, BILITOT, BILIDIR, IBILI in the last 72 hours. PT/INR No results for input(s): LABPROT, INR in the last 72 hours.   Impression / Plan: This is a 68 y.o.female  who presents for EGD/colonoscopy for evaluation of dysphagia and polyp surveillance.  The risks and benefits of endoscopic evaluation/treatment were discussed with the patient and/or family; these include but are not limited to the risk of perforation, infection, bleeding, missed  lesions, lack of diagnosis, severe illness requiring hospitalization, as well as anesthesia and sedation related illnesses.  The patient's history has been reviewed, patient examined, no change in status, and deemed stable for procedure.  The patient and/or family is agreeable to proceed.    Aloha Finner, MD [facility] Office # [phone]

## 2024-04-22 NOTE — Progress Notes (Signed)
 Pt's states no medical or surgical changes since previsit or office visit.

## 2024-04-22 NOTE — Patient Instructions (Signed)
 YOU HAD AN ENDOSCOPIC PROCEDURE TODAY AT THE [facility]:   Refer to the procedure report that was given to you for any specific questions about what was found during the examination.  If the procedure report does not answer your questions, please call your gastroenterologist to clarify.  If you requested that your care partner not be given the details of your procedure findings, then the procedure report has been included in a sealed envelope for you to review at your convenience later.  YOU SHOULD EXPECT: Some feelings of bloating in the abdomen. Passage of more gas than usual.  Walking can help get rid of the air that was put into your GI tract during the procedure and reduce the bloating. If you had a lower endoscopy (such as a colonoscopy or flexible sigmoidoscopy) you may notice spotting of blood in your stool or on the toilet paper. If you underwent a bowel prep for your procedure, you may not have a normal bowel movement for a few days.  Please Note:  You might notice some irritation and congestion in your nose or some drainage.  This is from the oxygen used during your procedure.  There is no need for concern and it should clear up in a day or so.  SYMPTOMS TO REPORT IMMEDIATELY:  Following lower endoscopy (colonoscopy or flexible sigmoidoscopy):  Excessive amounts of blood in the stool  Significant tenderness or worsening of abdominal pains  Swelling of the abdomen that is new, acute  Fever of 100F or higher  Following upper endoscopy (EGD)  Vomiting of blood or coffee ground material  New chest pain or pain under the shoulder blades  Painful or persistently difficult swallowing  New shortness of breath  Fever of 100F or higher  Black, tarry-looking stools  For urgent or emergent issues, a gastroenterologist can be reached at any hour by calling [phone]. Do not use MyChart messaging for urgent concerns.    DIET:  Follow a Post-Dilation Diet: Nothing to eat or  drink until 3:30 pm, CLEAR LIQUIDS ONLY from 3:30 pm to 4:30 pm. Then proceed to a SOFT DIET (see handout). You may proceed to your regular diet tomorrow morning as tolerated.  Drink plenty of fluids but you should avoid alcoholic beverages for 24 hours. Follow a High Fiber Diet in general.  MEDICATIONS: Continue present medications. You may resume Xarelto  TOMORROW EVENING as prior dose. Use FiberCon 1-2 tablets by mouth daily. You may use Cepacol or Halls throat lozenges and/or Chloraseptic spray for the next 72-96 hours to aid in sore throat should you experience this.  FOLLOW UP: Await pathology results. Repeat colonoscopy in 5-7 years for surveillance. If issues with dysphagia persist post-dilation, esophageal manometry should be considered. Repeat dilation can be considered in the future if improvement of dysphagia is longer standing.  Handouts given to patient: Esophageal stricture, Post-Dilation Diet, Polyps, Hemorrhoids, High Fiber Diet.  ACTIVITY:  You should plan to take it easy for the rest of today and you should NOT DRIVE or use heavy machinery until tomorrow (because of the sedation medicines used during the test).    FOLLOW UP: Our staff will call the number listed on your records the next business day following your procedure.  We will call around 7:15- 8:00 am to check on you and address any questions or concerns that you may have regarding the information given to you following your procedure. If we do not reach you, we will leave a message.  If any biopsies were taken you will be contacted by phone or by letter within the next 1-3 weeks.  Please call us  at [phone] if you have not heard about the biopsies in 3 weeks.    SIGNATURES/CONFIDENTIALITY: You and/or your care partner have signed paperwork which will be entered into your electronic medical record.  These signatures attest to the fact that that the information above on your After Visit Summary has been reviewed and  is understood.  Full responsibility of the confidentiality of this discharge information lies with you and/or your care-partner.

## 2024-04-22 NOTE — Op Note (Signed)
 [facility] Patient Name: Michele Nunez Procedure Date: 04/22/2024 1:54 PM MRN: [id number] Endoscopist: Aloha Finner , MD, [id number] Age: 68 Referring MD:  Date of Birth: Dec 29, 1955 Gender: Female Account #: [id number] Procedure:                Upper GI endoscopy Indications:              Dysphagia Medicines:                Monitored Anesthesia Care Procedure:                Pre-Anesthesia Assessment:                           - Prior to the procedure, a History and Physical                            was performed, and patient medications and                            allergies were reviewed. The patient's tolerance of                            previous anesthesia was also reviewed. The risks                            and benefits of the procedure and the sedation                            options and risks were discussed with the patient.                            All questions were answered, and informed consent                            was obtained. Prior Anticoagulants: The patient has                            taken no anticoagulant or antiplatelet agents. ASA                            Grade Assessment: III - A patient with severe                            systemic disease. After reviewing the risks and                            benefits, the patient was deemed in satisfactory                            condition to undergo the procedure.                           After obtaining informed consent, the endoscope was  passed under direct vision. Throughout the                            procedure, the patient's blood pressure, pulse, and                            oxygen saturations were monitored continuously. The                            Endoscope was introduced through the mouth, and                            advanced to the second part of duodenum. The upper                            GI endoscopy was accomplished  without difficulty.                            The patient tolerated the procedure. Scope In: Scope Out: Findings:                 No gross lesions were noted in the entire                            esophagus. After the rest of the EGD was completed,                            a guidewire was placed and the scope was withdrawn.                            Dilation was performed with a Savary dilator with                            mild resistance at 18 mm and moderate resistance at                            19 mm. The dilation site was examined following                            endoscope reinsertion and showed no change.                           The Z-line was irregular and was found 42 cm from                            the incisors.                           Multiple small semi-sessile polyps with no bleeding                            and no stigmata of recent bleeding were found in  the gastric fundus and in the gastric body. These                            are consistent with fundic gland polyps.                           Striped mildly erythematous mucosa without bleeding                            was found in the gastric antrum.                           No other gross lesions were noted in the entire                            examined stomach. Biopsies were taken with a cold                            forceps for histology and Helicobacter pylori                            testing.                           No gross lesions were noted in the duodenal bulb,                            in the first portion of the duodenum and in the                            second portion of the duodenum. Complications:            No immediate complications. Estimated Blood Loss:     Estimated blood loss was minimal. Impression:               - No gross lesions in the entire esophagus. Dilated                            to 19 mm savory.                            - Z-line irregular, 42 cm from the incisors.                           - Multiple gastric polyps (fundic gland in                            appearance).                           - Erythematous mucosa in the antrum. No other gross                            lesions in the entire stomach. Biopsied.                           -  No gross lesions in the duodenal bulb, in the                            first portion of the duodenum and in the second                            portion of the duodenum. Recommendation:           - Proceed to scheduled colonoscopy.                           - Dilation diet as per protocol.                           - Please use Cepacol or Halls Lozenges +/-                            Chloraseptic spray for next 72-96 hours to aid in                            sore thoat should you experience this.                           - Continue present medications.                           - Await pathology results.                           - If issues of dysphagia persist post dilation,                            esophageal manometry should be considered. Repeat                            dilation can be considered in the future if                            improvement of dysphagia is longer standing.                           - The findings and recommendations were discussed                            with the patient.                           - The findings and recommendations were discussed                            with the patient's family. Aloha Finner, MD 04/22/2024 2:38:47 PM

## 2024-04-22 NOTE — Progress Notes (Signed)
 Called to room to assist during endoscopic procedure.  Patient ID and intended procedure confirmed with present staff. Received instructions for my participation in the procedure from the performing physician.

## 2024-04-25 ENCOUNTER — Telehealth: Payer: Self-pay

## 2024-04-25 ENCOUNTER — Telehealth: Payer: Self-pay | Admitting: *Deleted

## 2024-04-25 NOTE — Telephone Encounter (Signed)
 Please see other telephone encounter of today's date 04/23/24. It was created in response to a secure chat. I did not see this message until afterwards.

## 2024-04-25 NOTE — Telephone Encounter (Signed)
  Follow up Call-     04/22/2024    1:08 PM  Call back number  Post procedure Call Back phone  # [phone]  Permission to leave phone message Yes     Patient questions:  Do you have a fever, pain , or abdominal swelling? No. Pain Score  0 *  Have you tolerated food without any problems? Yes.    Have you been able to return to your normal activities? Yes.    Do you have any questions about your discharge instructions: Diet   No. Medications  No. Follow up visit  No.  Do you have questions or concerns about your Care? Yes: pt states that after her procedure she had pain in her left calf. 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 States they did a scan on her leg but said they couldn't see one area and recommended she come back in two days to have it checked again. Pt states she restarted Xarelto  on 7/26 per MD recommendations. Denies pain or swelling in leg at this time. Pt is asking if she needs to go back. RN instructed pt that if she is concerned about a blood clot she should seek medical attention from her PCP or the MD that prescribes her Xarelto . Pt verbalized understanding.   Actions: * If pain score is 4 or above: No action needed, pain <4.

## 2024-04-25 NOTE — Telephone Encounter (Signed)
 Spoke with patient.  She had called Dr Kristie about her concerns that she maybe should have gotten thos shots in my belly since I stopped the Xarelto . She recalled that when she was put on Xarelto  after the last blood clot that she was told that anytime she was put to sleep, she should get shots in my belly.  The patient went to the ER in [place] on Saturday 04/23/24 with a concern about a knot she felt in her leg. Patient restarted the Xarelto  on 04/23/24. She had an ultrasound of my leg and I was told there wasn't a clot. She agrees to follow up with her PCP to determine if any further testing or treatment needs to be done. She denies any chest pain or shortness of breath. She denies any leg or calf pain.

## 2024-04-25 NOTE — Telephone Encounter (Signed)
 Thank you [name] for the update. We had obtained approval for the hold of her Xarelto /anticoagulation as per our documentation.  In the future if it is felt that she needs Lovenox  bridging, we will allow the prescribing provider to move forward with that if they feel necessary for any further procedures she could need in the future, again if they feel it is necessary. I am glad that she did have a negative ultrasound, but agree that if she has any further issues she does need to follow-up with PCP or urgent care. Thanks. GM

## 2024-04-25 NOTE — Telephone Encounter (Signed)
 [name], I heard from Dr. Kristie, who is covering over the weekend, that this patient had called. It was not clear to me if she had been diagnosed with a blood clot or not. Also do not remember hearing that she was having issues with her calf on the day of her procedure. In any case, I will have [name] follow-up with her. If she continues to have issues or discomfort, I agree she needs to be seen by PCP or urgent care and have consideration of Doppler imaging to her calf. She should have already restarted her blood thinner.  Pod C nurses, Please reach out to patient and find out if she is still having issues, and agree with RN Debruller, that she needs to seek care with PCP or urgent care to further evaluate if there are any concerns about calf pain discomfort or swelling. Please give me an update later today. If she does decide to have further care pursued, please have her return a call to us  or put a call into her by Wednesday to see what has happened. Thank you. GM

## 2024-04-27 ENCOUNTER — Ambulatory Visit: Payer: Self-pay | Admitting: Gastroenterology

## 2024-04-27 LAB — SURGICAL PATHOLOGY

## 2024-06-06 ENCOUNTER — Encounter: Payer: Self-pay | Admitting: *Deleted

## 2024-06-06 ENCOUNTER — Encounter: Payer: Self-pay | Admitting: Cardiology

## 2024-06-06 ENCOUNTER — Ambulatory Visit: Attending: Cardiology | Admitting: Cardiology

## 2024-06-06 VITALS — BP 134/72 | HR 64 | Ht 68.0 in | Wt 268.2 lb

## 2024-06-06 DIAGNOSIS — R079 Chest pain, unspecified: Secondary | ICD-10-CM

## 2024-06-06 DIAGNOSIS — I10 Essential (primary) hypertension: Secondary | ICD-10-CM

## 2024-06-06 DIAGNOSIS — Z86718 Personal history of other venous thrombosis and embolism: Secondary | ICD-10-CM

## 2024-06-06 DIAGNOSIS — E782 Mixed hyperlipidemia: Secondary | ICD-10-CM | POA: Diagnosis not present

## 2024-06-06 NOTE — Patient Instructions (Signed)
 Medication Instructions:  Continue all current medications.   Labwork: none  Testing/Procedures: none  Follow-Up: 6 months   Any Other Special Instructions Will Be Listed Below (If Applicable).   If you need a refill on your cardiac medications before your next appointment, please call your pharmacy.

## 2024-06-06 NOTE — Progress Notes (Signed)
 Clinical Summary Michele Nunez is a 68 y.o.female seen today as a new patient for the following medical problems  Previously followed by Dr Cleotilde in [place]  Chest pain - was to have a stress cardiolite based on 11/2023 notes. Results not included in records from Dr Cleotilde - she reports stress test was benign.  -01/2023 echo: LVEF 60-65%, normal RV function, normal diastolic function, mild LAE, mild MD and TR.  - nonspecific sharp pains at times in chest   2.HTN - on toprol 50mg , aldactone 25mg ,  hydrochlorothiazide 12.5mg , irbesartan 300mg  daily - home 110s/120/60s-70s   3. History of DVT - unclear history, prior cardiology notes mention recurrent DVT. 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    4. Varicose veins  5.HLD  - 01/2024 TC 168 TG 232 HDL 47 LDL 82   Past Medical History:  Diagnosis Date   Anxiety    panic attacks   Arthritis    probably   Complication of anesthesia    could not breath and heard the doctor saying she was turning blue-woke up with bruises on chest and very sore for kidney stone surgery-anesthesia record on chart   Gallstones    GERD (gastroesophageal reflux disease)    History of kidney stones    Hypercholesteremia    mild   Hyperparathyroidism (HCC)    Hyperthyroidism    Parathyroid  adenoma    Pulmonary embolism (HCC)    Shortness of breath    with activity   Status post dilation of esophageal narrowing      No Known Allergies   Current Outpatient Medications  Medication Sig Dispense Refill   FERROCITE 324 MG TABS tablet Take 1 tablet by mouth daily.     hydrochlorothiazide (HYDRODIURIL) 25 MG tablet Take 25 mg by mouth daily. Takes 1/2 pill     irbesartan (AVAPRO) 150 MG tablet Take 150 mg by mouth daily. (Patient taking differently: Take 300 mg by mouth daily.)     metoprolol tartrate (LOPRESSOR) 50 MG tablet Take 50 mg by mouth daily.     Multiple Vitamin (MULTIVITAMIN) tablet Take 1 tablet  by mouth daily. (Patient not taking: Reported on 04/22/2024)     omeprazole (PRILOSEC) 40 MG capsule Take 40 mg by mouth every morning.     rosuvastatin (CRESTOR) 10 MG tablet Take 10 mg by mouth daily.     spironolactone (ALDACTONE) 50 MG tablet Take 50 mg by mouth daily.     XARELTO  20 MG TABS tablet TAKE 1 TABLET BY MOUTH EVERY EVENING WITH SUPPER 30 tablet 0   Current Facility-Administered Medications  Medication Dose Route Frequency Provider Last Rate Last Admin   0.9 %  sodium chloride  infusion  500 mL Intravenous Once [name], [name]., MD         Past Surgical History:  Procedure Laterality Date   ANKLE SURGERY Left    CARDIAC CATHETERIZATION  20 years ago   clear   CHOLECYSTECTOMY  09/2013   COLONOSCOPY     FINGER SURGERY Right    ring finger-pin   KIDNEY STONE SURGERY  08/2013   MINIMALLY INVASIVE RADIOACTIVE PARATHYROIDECTOMY N/A 05/31/2014   Procedure: LEFT INFERIOR PARATHYROIDECTOMY ;  Surgeon: Elon Pacini, MD;  Location: [facility];  Service: General;  Laterality: N/A;   TIBIA FRACTURE SURGERY Right    TONSILLECTOMY  in 20's   TOTAL KNEE ARTHROPLASTY Left    WRIST FRACTURE SURGERY Right 1995  No Known Allergies    Family History  Problem Relation Age of Onset   Diabetes Mother    Heart disease Father    Ovarian cancer Sister    Diabetes Maternal Grandmother    Lymphoma Son    Colon cancer Neg Hx    Esophageal cancer Neg Hx    Inflammatory bowel disease Neg Hx    Liver disease Neg Hx    Pancreatic cancer Neg Hx    Rectal cancer Neg Hx    Stomach cancer Neg Hx      Social History Michele Nunez reports that she quit smoking about 35 years ago. Her smoking use included cigarettes. She started smoking about 55 years ago. She has a 20 pack-year smoking history. She has never used smokeless tobacco. Michele Nunez reports no history of alcohol use.     Physical Examination Today's Vitals   06/06/24 1458  BP: 134/72  Pulse: 64  SpO2: 96%  Weight:  268 lb 3.2 oz (121.7 kg)  Height: 5' 8 (1.727 m)   Body mass index is 40.78 kg/m.  Gen: resting comfortably, no acute distress HEENT: no scleral icterus, pupils equal round and reactive, no palptable cervical adenopathy,  CV: RRR, no m/rg, no vjd Resp: Clear to auscultation bilaterally GI: abdomen is soft, non-tender, non-distended, normal bowel sounds, no hepatosplenomegaly MSK: extremities are warm, no edema.  Skin: warm, no rash Neuro:  no focal deficits Psych: appropriate affect    Assessment and Plan  1.History of chest pain - request records from stress test earlier this year in [place], she reports was benign. Echo last year was also overall benign - no recent cardiac chest pains, continue to monitor - EKG today shows NSR, no ischemic changes  2. HTN - at goal, continue her current regimen from her previous provider  3. HLD - LDL at goal, continue current meds. Work on dietary changes to lower TGs  4. History of recurrent DVT/PE - she has previously been committed to lilfelong anticoagulation, continue xarelto .     Dorn PHEBE Ross, M.D

## 2024-06-07 ENCOUNTER — Encounter: Payer: Self-pay | Admitting: *Deleted

## 2024-06-22 ENCOUNTER — Ambulatory Visit (HOSPITAL_BASED_OUTPATIENT_CLINIC_OR_DEPARTMENT_OTHER): Admitting: Cardiology

## 2024-07-04 ENCOUNTER — Encounter: Payer: Self-pay | Admitting: *Deleted

## 2024-07-04 ENCOUNTER — Encounter: Payer: Self-pay | Admitting: Cardiology

## 2024-09-28 ENCOUNTER — Other Ambulatory Visit: Payer: Self-pay | Admitting: Cardiology

## 2024-09-28 NOTE — Telephone Encounter (Signed)
" °*  STAT* If patient is at the pharmacy, call can be transferred to refill team.   1. Which medications need to be refilled? (please list name of each medication and dose if known) spironolactone (ALDACTONE) 50 MG tablet    2. Would you like to learn more about the convenience, safety, & potential cost savings by using the [facility]?    3. Are you open to using the [facility] (Type [facility].) No   4. Which pharmacy/location (including street and city if local pharmacy) is medication to be sent to?  [facility] [address] [facility] [employer] [place], [place] - [address] [place] Phone: [phone]         5. Do they need a 30 day or 90 day supply? 90 day  "

## 2024-10-04 MED ORDER — SPIRONOLACTONE 50 MG PO TABS: 50.0000 mg | ORAL_TABLET | Freq: Every day | ORAL | 2 refills | Status: DC

## 2024-10-04 NOTE — Telephone Encounter (Signed)
 per pt her script says 25 mg tablet take one daily. She is out of medication.

## 2024-10-06 ENCOUNTER — Telehealth: Payer: Self-pay | Admitting: Cardiology

## 2024-10-06 MED ORDER — SPIRONOLACTONE 25 MG PO TABS: 25.0000 mg | ORAL_TABLET | Freq: Every day | ORAL | 3 refills | Status: AC

## 2024-10-06 NOTE — Telephone Encounter (Signed)
 Pt c/o medication issue:  1. Name of Medication:   spironolactone  (ALDACTONE ) 50 MG tablet   2. How are you currently taking this medication (dosage and times per day)?   3. Are you having a reaction (difficulty breathing--STAT)?   4. What is your medication issue?   Patient wants to confirm what her dosage should be.

## 2024-10-06 NOTE — Telephone Encounter (Signed)
 Spoke with patient advised her of Dr. Alvan response: Should be aldactone  25mg  daily   J BrancH MD  Advised patient I will end a new script she stated that she will break the ones in half she has right now. Patient verbalized understanding

## 2024-12-12 ENCOUNTER — Ambulatory Visit: Admitting: Cardiology
